# Patient Record
Sex: Male | Born: 1980 | Race: White | Hispanic: No | Marital: Single | Smoking: Current some day smoker
Health system: Southern US, Community
[De-identification: ages and names within clinical notes are randomized; demographics above are authoritative.]

## PROBLEM LIST (undated history)

## (undated) HISTORY — PX: APPENDECTOMY: SHX54

## (undated) HISTORY — PX: ABDOMINAL SURGERY: SHX537

---

## 2014-04-19 ENCOUNTER — Emergency Department (INDEPENDENT_AMBULATORY_CARE_PROVIDER_SITE_OTHER)
Admission: EM | Admit: 2014-04-19 | Discharge: 2014-04-19 | Disposition: A | Discharge status: Home / Self Care | Payer: Self-pay | Source: Home / Self Care | Attending: Family Medicine | Admitting: Family Medicine

## 2014-04-19 ENCOUNTER — Encounter (HOSPITAL_COMMUNITY): Payer: Self-pay | Admitting: Emergency Medicine

## 2014-04-19 DIAGNOSIS — K047 Periapical abscess without sinus: Secondary | ICD-10-CM

## 2014-04-19 DIAGNOSIS — J0100 Acute maxillary sinusitis, unspecified: Secondary | ICD-10-CM

## 2014-04-19 MED ORDER — IPRATROPIUM BROMIDE 0.06 % NA SOLN: 2.0000 | Freq: Four times a day (QID) | NASAL | Status: DC

## 2014-04-19 MED ORDER — AMOXICILLIN-POT CLAVULANATE 875-125 MG PO TABS: 1.0000 | ORAL_TABLET | Freq: Two times a day (BID) | ORAL | Status: DC

## 2014-04-19 MED ORDER — FLUTICASONE PROPIONATE 50 MCG/ACT NA SUSP: 2.0000 | Freq: Every day | NASAL | Status: DC

## 2014-04-19 NOTE — ED Notes (Signed)
C/o cold sx onset 4-5 days; sx include congestion and PND Denies cough, fevers, chills Also c/o upper dental pain onset 2 days Does not have a dentist; just moved from [place] a month ago Alert, no signs of acute distress.

## 2014-04-19 NOTE — ED Provider Notes (Signed)
CSN: [id number]     Arrival date & time 04/19/14  [id number] History   First MD Initiated Contact with Patient 04/19/14 1028     Chief Complaint  Patient presents with  . URI  . Dental Pain   (Consider location/radiation/quality/duration/timing/severity/associated sxs/prior Treatment) HPI  Cold symptoms: started 5 days ago. Sinus congestion. Now with sore throat. Denies fevers, nausea vomiting diarrhea rash, CP, SOB. OTC cold meds w/o much benefit.   Dental pain: Upper L canine tooth. Broke several months ago. Developed pain in tooth 2 days ago. Denies purulence or bloody discharge. Getting worse. Symptoms are constant. Meloxicam w/ some improvement.   Tobacco use. Changed from 1ppd to Vapors. Down from 24mg  to 10mg  of nicotine.   History reviewed. No pertinent past medical history. History reviewed. No pertinent past surgical history. Family History  Problem Relation Age of Onset  . Cancer Neg Hx   . Diabetes Neg Hx   . Heart failure Neg Hx    History  Substance Use Topics  . Smoking status: Former Smoker    Types: Cigarettes  . Smokeless tobacco: Not on file  . Alcohol Use: Yes    Review of Systems Per HPI with all other pertinent systems negative.  Allergies  Review of patient's allergies indicates no known allergies.  Home Medications   Prior to Admission medications   Medication Sig Start Date End Date Taking? Authorizing Provider  amoxicillin-clavulanate (AUGMENTIN) 875-125 MG per tablet Take 1 tablet by mouth 2 (two) times daily. 04/19/14   Ozella Rocksavid J Adreanna Fickel, MD  fluticasone (FLONASE) 50 MCG/ACT nasal spray Place 2 sprays into both nostrils at bedtime. 04/19/14   Ozella Rocksavid J Jerman Tinnon, MD  ipratropium (ATROVENT) 0.06 % nasal spray Place 2 sprays into both nostrils 4 (four) times daily. 04/19/14   Ozella Rocksavid J Norberto Wishon, MD   BP 127/88 mmHg  Pulse 64  Temp(Src) 97.9 F (36.6 C) (Oral)  Resp 16  SpO2 99% Physical Exam  Constitutional: He is oriented to person, place, and time. He  appears well-developed and well-nourished.  HENT:  Head: Normocephalic.  Left maxillary sinus tender to palpation.  Numerous dental caries with canine broken off at the level of the gums with surrounding gingival swelling and tenderness to palpation. No purulent or bloody discharge, no fluctuance.  Eyes: EOM are normal. Pupils are equal, round, and reactive to light.  Neck: Normal range of motion.  Cardiovascular: Normal rate and normal heart sounds.   No murmur heard. Pulmonary/Chest: Effort normal and breath sounds normal. No respiratory distress.  Abdominal: He exhibits no distension.  Musculoskeletal: Normal range of motion.  Neurological: He is alert and oriented to person, place, and time.  Skin: Skin is warm.  Psychiatric: He has a normal mood and affect. His behavior is normal. Judgment and thought content normal.    ED Course  Procedures (including critical care time) Labs Review Labs Reviewed - No data to display  Imaging Review No results found.   MDM   1. Acute maxillary sinusitis, recurrence not specified   2. Dental infection    Start augmentin Nasal atrovent and flonase NSAIDs Continue to wean off nicotine vapor Precautions given and all questions answered  Shelly Flattenavid Tysen Roesler, MD Family Medicine 04/19/2014, 10:53 AM      Ozella Rocksavid J Kaamil Morefield, MD 04/19/14 1053

## 2014-04-19 NOTE — Discharge Instructions (Signed)
You have a double infection including a left-sided sinus infection as well as a dental infection. Please take the antibiotics for this. Please use the nasal Atrovent and nasal Flonase for sinus congestion and nasal discharge. Please use ibuprofen 400-600 mg every 6 hours for pain.

## 2015-01-30 ENCOUNTER — Encounter: Payer: Self-pay | Admitting: Emergency Medicine

## 2015-01-30 ENCOUNTER — Emergency Department
Admission: EM | Admit: 2015-01-30 | Discharge: 2015-01-30 | Disposition: A | Discharge status: Home / Self Care | Payer: Self-pay | Attending: Emergency Medicine | Admitting: Emergency Medicine

## 2015-01-30 ENCOUNTER — Emergency Department: Payer: Self-pay

## 2015-01-30 DIAGNOSIS — M62830 Muscle spasm of back: Secondary | ICD-10-CM | POA: Insufficient documentation

## 2015-01-30 DIAGNOSIS — F17210 Nicotine dependence, cigarettes, uncomplicated: Secondary | ICD-10-CM | POA: Insufficient documentation

## 2015-01-30 DIAGNOSIS — M546 Pain in thoracic spine: Secondary | ICD-10-CM | POA: Insufficient documentation

## 2015-01-30 MED ORDER — CYCLOBENZAPRINE HCL 10 MG PO TABS: 10.0000 mg | ORAL_TABLET | Freq: Every day | ORAL | Status: DC

## 2015-01-30 MED ORDER — NAPROXEN 500 MG PO TABS: 500.0000 mg | ORAL_TABLET | Freq: Two times a day (BID) | ORAL | Status: DC

## 2015-01-30 MED ORDER — KETOROLAC TROMETHAMINE 30 MG/ML IJ SOLN
30.0000 mg | Freq: Once | INTRAMUSCULAR | Status: AC
  Administered 2015-01-30: 30 mg via INTRAMUSCULAR
  Filled 2015-01-30: qty 1

## 2015-01-30 NOTE — ED Notes (Signed)
To x-ray via stretcher.
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
## 2015-01-30 NOTE — Discharge Instructions (Signed)
Back Exercises °The following exercises strengthen the muscles that help to support the back. They also help to keep the lower back flexible. Doing these exercises can help to prevent back pain or lessen existing pain. °If you have back pain or discomfort, try doing these exercises 2-3 times each day or as told by your health care provider. When the pain goes away, do them once each day, but increase the number of times that you repeat the steps for each exercise (do more repetitions). If you do not have back pain or discomfort, do these exercises once each day or as told by your health care provider. °EXERCISES °Single Knee to Chest °Repeat these steps 3-5 times for each leg: °· Lie on your back on a firm bed or the floor with your legs extended. °· Bring one knee to your chest. Your other leg should stay extended and in contact with the floor. °· Hold your knee in place by grabbing your knee or thigh. °· Pull on your knee until you feel a gentle stretch in your lower back. °· Hold the stretch for 10-30 seconds. °· Slowly release and straighten your leg. °Pelvic Tilt °Repeat these steps 5-10 times: °· Lie on your back on a firm bed or the floor with your legs extended. °· Bend your knees so they are pointing toward the ceiling and your feet are flat on the floor. °· Tighten your lower abdominal muscles to press your lower back against the floor. This motion will tilt your pelvis so your tailbone points up toward the ceiling instead of pointing to your feet or the floor. °· With gentle tension and even breathing, hold this position for 5-10 seconds. °Cat-Cow °Repeat these steps until your lower back becomes more flexible: °· Get into a hands-and-knees position on a firm surface. Keep your hands under your shoulders, and keep your knees under your hips. You may place padding under your knees for comfort. °· Let your head hang down, and point your tailbone toward the floor so your lower back becomes rounded like the  back of a cat. °· Hold this position for 5 seconds. °· Slowly lift your head and point your tailbone up toward the ceiling so your back forms a sagging arch like the back of a cow. °· Hold this position for 5 seconds. °Press-Ups °Repeat these steps 5-10 times: °· Lie on your abdomen (face-down) on the floor. °· Place your palms near your head, about shoulder-width apart. °· While you keep your back as relaxed as possible and keep your hips on the floor, slowly straighten your arms to raise the top half of your body and lift your shoulders. Do not use your back muscles to raise your upper torso. You may adjust the placement of your hands to make yourself more comfortable. °· Hold this position for 5 seconds while you keep your back relaxed. °· Slowly return to lying flat on the floor. °Bridges °Repeat these steps 10 times: °· Lie on your back on a firm surface. °· Bend your knees so they are pointing toward the ceiling and your feet are flat on the floor. °· Tighten your buttocks muscles and lift your buttocks off of the floor until your waist is at almost the same height as your knees. You should feel the muscles working in your buttocks and the back of your thighs. If you do not feel these muscles, slide your feet 1-2 inches farther away from your buttocks. °· Hold this position for 3-5   seconds.  Slowly lower your hips to the starting position, and allow your buttocks muscles to relax completely. If this exercise is too easy, try doing it with your arms crossed over your chest. Abdominal Crunches Repeat these steps 5-10 times: 1. Lie on your back on a firm bed or the floor with your legs extended. 2. Bend your knees so they are pointing toward the ceiling and your feet are flat on the floor. 3. Cross your arms over your chest. 4. Tip your chin slightly toward your chest without bending your neck. 5. Tighten your abdominal muscles and slowly raise your trunk (torso) high enough to lift your shoulder blades  a tiny bit off of the floor. Avoid raising your torso higher than that, because it can put too much stress on your low back and it does not help to strengthen your abdominal muscles. 6. Slowly return to your starting position. Back Lifts Repeat these steps 5-10 times: 1. Lie on your abdomen (face-down) with your arms at your sides, and rest your forehead on the floor. 2. Tighten the muscles in your legs and your buttocks. 3. Slowly lift your chest off of the floor while you keep your hips pressed to the floor. Keep the back of your head in line with the curve in your back. Your eyes should be looking at the floor. 4. Hold this position for 3-5 seconds. 5. Slowly return to your starting position. SEEK MEDICAL CARE IF:  Your back pain or discomfort gets much worse when you do an exercise.  Your back pain or discomfort does not lessen within 2 hours after you exercise. If you have any of these problems, stop doing these exercises right away. Do not do them again unless your health care provider says that you can. SEEK IMMEDIATE MEDICAL CARE IF:  You develop sudden, severe back pain. If this happens, stop doing the exercises right away. Do not do them again unless your health care provider says that you can.   This information is not intended to replace advice given to you by your health care provider. Make sure you discuss any questions you have with your health care provider.   Document Released: 03/13/2004 Document Revised: 10/25/2014 Document Reviewed: 03/30/2014 Elsevier Interactive Patient Education 2016 Alvan therapy can help ease sore, stiff, injured, and tight muscles and joints. Heat relaxes your muscles, which may help ease your pain.  RISKS AND COMPLICATIONS If you have any of the following conditions, do not use heat therapy unless your health care provider has approved:  Poor circulation.  Healing wounds or scarred skin in the area being  treated.  Diabetes, heart disease, or high blood pressure.  Not being able to feel (numbness) the area being treated.  Unusual swelling of the area being treated.  Active infections.  Blood clots.  Cancer.  Inability to communicate pain. This may include young children and people who have problems with their brain function (dementia).  Pregnancy. Heat therapy should only be used on old, pre-existing, or long-lasting (chronic) injuries. Do not use heat therapy on new injuries unless directed by your health care provider. HOW TO USE HEAT THERAPY There are several different kinds of heat therapy, including:  Moist heat pack.  Warm water bath.  Hot water bottle.  Electric heating pad.  Heated gel pack.  Heated wrap.  Electric heating pad. Use the heat therapy method suggested by your health care provider. Follow your health care provider's instructions on when  and how to use heat therapy. GENERAL HEAT THERAPY RECOMMENDATIONS  Do not sleep while using heat therapy. Only use heat therapy while you are awake.  Your skin may turn pink while using heat therapy. Do not use heat therapy if your skin turns red.  Do not use heat therapy if you have new pain.  High heat or long exposure to heat can cause burns. Be careful when using heat therapy to avoid burning your skin.  Do not use heat therapy on areas of your skin that are already irritated, such as with a rash or sunburn. SEEK MEDICAL CARE IF:  You have blisters, redness, swelling, or numbness.  You have new pain.  Your pain is worse. MAKE SURE YOU:  Understand these instructions.  Will watch your condition.  Will get help right away if you are not doing well or get worse.   This information is not intended to replace advice given to you by your health care provider. Make sure you discuss any questions you have with your health care provider.   Document Released: 04/28/2011 Document Revised: 02/24/2014 Document  Reviewed: 03/29/2013 Elsevier Interactive Patient Education [employer] Inc[employer]
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
## 2015-01-30 NOTE — ED Notes (Signed)
States he has a hx of lower back pain but developed pain to mid back and radiates into neck. But also having some numbness to left leg denies any trauma or fever

## 2015-04-12 ENCOUNTER — Emergency Department
Admission: EM | Admit: 2015-04-12 | Discharge: 2015-04-12 | Disposition: A | Discharge status: Home / Self Care | Payer: Self-pay | Attending: Emergency Medicine | Admitting: Emergency Medicine

## 2015-04-12 ENCOUNTER — Encounter: Payer: Self-pay | Admitting: Emergency Medicine

## 2015-04-12 DIAGNOSIS — K047 Periapical abscess without sinus: Secondary | ICD-10-CM | POA: Insufficient documentation

## 2015-04-12 DIAGNOSIS — K0381 Cracked tooth: Secondary | ICD-10-CM | POA: Insufficient documentation

## 2015-04-12 DIAGNOSIS — F17210 Nicotine dependence, cigarettes, uncomplicated: Secondary | ICD-10-CM | POA: Insufficient documentation

## 2015-04-12 DIAGNOSIS — K0889 Other specified disorders of teeth and supporting structures: Secondary | ICD-10-CM | POA: Insufficient documentation

## 2015-04-12 DIAGNOSIS — Z79899 Other long term (current) drug therapy: Secondary | ICD-10-CM | POA: Insufficient documentation

## 2015-04-12 DIAGNOSIS — Z791 Long term (current) use of non-steroidal anti-inflammatories (NSAID): Secondary | ICD-10-CM | POA: Insufficient documentation

## 2015-04-12 DIAGNOSIS — K029 Dental caries, unspecified: Secondary | ICD-10-CM

## 2015-04-12 DIAGNOSIS — K0263 Dental caries on smooth surface penetrating into pulp: Secondary | ICD-10-CM | POA: Insufficient documentation

## 2015-04-12 MED ORDER — PENICILLIN V POTASSIUM 500 MG PO TABS
500.0000 mg | ORAL_TABLET | Freq: Once | ORAL | Status: AC
  Administered 2015-04-12: 500 mg via ORAL
  Filled 2015-04-12: qty 1

## 2015-04-12 MED ORDER — PENICILLIN V POTASSIUM 500 MG PO TABS: 500.0000 mg | ORAL_TABLET | Freq: Four times a day (QID) | ORAL | Status: DC

## 2015-04-12 NOTE — ED Provider Notes (Signed)
[facility] Emergency Department Provider Note ____________________________________________  Time seen: 52  I have reviewed the triage vital signs and the nursing notes.  HISTORY  Chief Complaint  Dental Pain  HPI Edgar Matthews is a 35 y.o. male with the complaint of left upper face swelling and pain. He reports a chronically broken left upper canine tooth. He noted facial swelling, pain and purulent drainage in the mouth. He denies fevers, chills, or sweats. He rates his pain at a 5/10 in triage.  History reviewed. No pertinent past medical history.  There are no active problems to display for this patient.  Past Surgical History  Procedure Laterality Date  . Appendectomy    . Abdominal surgery      Current Outpatient Rx  Name  Route  Sig  Dispense  Refill  . cyclobenzaprine (FLEXERIL) 10 MG tablet   Oral   Take 1 tablet (10 mg total) by mouth at bedtime.   30 tablet   0   . naproxen (NAPROSYN) 500 MG tablet   Oral   Take 1 tablet (500 mg total) by mouth 2 (two) times daily with a meal.   30 tablet   0   . penicillin v potassium (VEETID) 500 MG tablet   Oral   Take 1 tablet (500 mg total) by mouth 4 (four) times daily.   40 tablet   0     Allergies Review of patient's allergies indicates no known allergies.  Family History  Problem Relation Age of Onset  . Cancer Neg Hx   . Diabetes Neg Hx   . Heart failure Neg Hx     Social History Social History  Substance Use Topics  . Smoking status: Current Some Day Smoker -- 0.50 packs/day    Types: Cigarettes  . Smokeless tobacco: None  . Alcohol Use: Yes   Review of Systems  Constitutional: Negative for fever. Eyes: Negative for visual changes. ENT: Negative for sore throat. Dental pain as above Cardiovascular: Negative for chest pain. Respiratory: Negative for shortness of breath. Gastrointestinal: Negative for abdominal pain, vomiting and diarrhea. Skin: Negative for  rash. Neurological: Negative for headaches, focal weakness or numbness. ____________________________________________  PHYSICAL EXAM:  VITAL SIGNS: ED Triage Vitals  Enc Vitals Group     BP 04/12/15 1707 117/75 mmHg     Pulse Rate 04/12/15 1707 72     Resp 04/12/15 1707 16     Temp 04/12/15 1707 98.6 F (37 C)     Temp Source 04/12/15 1707 Oral     SpO2 04/12/15 1707 98 %     Weight 04/12/15 1707 180 lb (81.647 kg)     Height 04/12/15 1707 6' (1.829 m)     Head Cir --      Peak Flow --      Pain Score 04/12/15 1707 5     Pain Loc --      Pain Edu? --      Excl. in GC? --    Constitutional: Alert and oriented. Well appearing and in no distress. Head: Normocephalic and atraumatic.      Eyes: Conjunctivae are normal. PERRL. Normal extraocular movements   Nose: No congestion/rhinorrhea.   Mouth/Throat: Mucous membranes are moist. Uvula is midline. Tonsils are flat. Patient noted to have chronic dental caries through the mouth. His pain is localized at the upper left canine which is broken and decayed to the gumline. No appreciable gum swelling, fluctuance, or pointing abscess is appreciated.  Neck: Supple. No thyromegaly. Hematological/Lymphatic/Immunological: No cervical lymphadenopathy. Cardiovascular: Normal rate, regular rhythm.  Respiratory: Normal respiratory effort. No wheezes/rales/rhonchi. Musculoskeletal: Nontender with normal range of motion in all extremities.  Neurologic:  Normal gait without ataxia. Normal speech and language. No gross focal neurologic deficits are appreciated. Skin:  Skin is warm, dry and intact. No rash noted. Psychiatric: Mood and affect are normal. Patient exhibits appropriate insight and judgment. ____________________________________________  PROCEDURES  Pen VK 500 mg PO ____________________________________________  INITIAL IMPRESSION / ASSESSMENT AND PLAN / ED COURSE  Patient with acute dental pain secondary to chronic dental  caries and a chronically fractured canine tooth. He'll be treated empirically for dental abscess with penicillin. He is encouraged to follow-up with the dental provider on the list that is given. Patient is to rinse daily with warm salt water and use a soft bristle toothbrush for routine dental hygiene. Return to the ED for acutely worsening symptoms. 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

## 2015-04-12 NOTE — Discharge Instructions (Signed)
Dental Abscess A dental abscess is pus in or around a tooth. HOME CARE  Take medicines only as told by your dentist.  If you were prescribed antibiotic medicine, finish all of it even if you start to feel better.  Rinse your mouth (gargle) often with salt water.  Do not drive or use heavy machinery, like a lawn mower, while taking pain medicine.  Do not apply heat to the outside of your mouth.  Keep all follow-up visits as told by your dentist. This is important. GET HELP IF:  Your pain is worse, and medicine does not help. GET HELP RIGHT AWAY IF:  You have a fever or chills.  Your symptoms suddenly get worse.  You have a very bad headache.  You have problems breathing or swallowing.  You have trouble opening your mouth.  You have puffiness (swelling) in your neck or around your eye.   This information is not intended to replace advice given to you by your health care provider. Make sure you discuss any questions you have with your health care provider.   Document Released: 06/20/2014 Document Reviewed: 06/20/2014 Elsevier Interactive Patient Education 2016 Elsevier Inc.  Dental Caries Dental caries is tooth decay. This decay can cause a hole in teeth (cavity) that can get bigger and deeper over time. HOME CARE  Brush and floss your teeth. Do this at least two times a day.  Use a fluoride toothpaste.  Use a mouth rinse if told by your dentist or doctor.  Eat less sugary and starchy foods. Drink less sugary drinks.  Avoid snacking often on sugary and starchy foods. Avoid sipping often on sugary drinks.  Keep regular checkups and cleanings with your dentist.  Use fluoride supplements if told by your dentist or doctor.  Allow fluoride to be applied to teeth if told by your dentist or doctor.   This information is not intended to replace advice given to you by your health care provider. Make sure you discuss any questions you have with your health care  provider.   Document Released: 11/13/2007 Document Revised: 02/24/2014 Document Reviewed: 02/06/2012 Elsevier Interactive Patient Education 2016 Elsevier Inc.  Dental Pain Dental pain may be caused by many things, including:  Tooth decay (cavities or caries). Cavities cause the nerve of your tooth to be open to air and hot or cold temperatures. This can cause pain or discomfort.  Abscess or infection. A dental abscess is an area that is full of infected pus from a bacterial infection in the inner part of the tooth (pulp). It usually happens at the end of the tooth's root.  Injury.  An unknown reason (idiopathic). Your pain may be mild or severe. It may only happen when:  You are chewing.  You are exposed to hot or cold temperature.  You are eating or drinking sugary foods or beverages, such as:  Soda.  Candy. Your pain may also be there all of the time. HOME CARE Watch your dental pain for any changes. Do these things to lessen your discomfort:  Take medicines only as told by your dentist.  If your dentist tells you to take an antibiotic medicine, finish all of it even if you start to feel better.  Keep all follow-up visits as told by your dentist. This is important.  Do not apply heat to the outside of your face.  Rinse your mouth or gargle with salt water if told by your dentist. This helps with pain and swelling.  You can  make salt water by adding  tsp of salt to 1 cup of warm water.  Apply ice to the painful area of your face:  Put ice in a plastic bag.  Place a towel between your skin and the bag.  Leave the ice on for 20 minutes, 2-3 times per day.  Avoid foods or drinks that cause you pain, such as:  Very hot or very cold foods or drinks.  Sweet or sugary foods or drinks. GET HELP IF:  Your pain is not helped with medicines.  Your symptoms are worse.  You have new symptoms. GET HELP RIGHT AWAY IF:  You cannot open your mouth.  You are having  trouble breathing or swallowing.  You have a fever.  Your face, neck, or jaw is puffy (swollen).   This information is not intended to replace advice given to you by your health care provider. Make sure you discuss any questions you have with your health care provider.   Document Released: 07/23/2007 Document Revised: 06/20/2014 Document Reviewed: 01/30/2014 Elsevier Interactive Patient Education 2016 Elsevier Inc.  Preventive Dental Care, Adult Preventive dental care includes seeing a dentist regularly and practicing good dental care (oral hygiene) at home. These actions can help to prevent cavities and other tooth problems, root canal problems, gum disease (gingivitis), and tooth loss. Regular dental exams may also help your health care provider to diagnose other medical problems. Many diseases, including mouth cancers, have early signs that can be found during a preventive dental care visit. WHAT CAN I EXPECT DURING MY DENTAL VISITS? Many adults see their dentist one or two times each year for oral exams and cleanings. Talk with your dentist about the best preventive dental care schedule for you. At your visit, your dentist may ask you about:  Your overall health and diet.  Any new symptoms, such as:  Bleeding gums.  Mouth, tooth, or jaw pain. Your dentist will do a mouth (oral) exam to check for:  Cavities.  Gingivitis or other problems.  Signs of cancer.  Neck swelling or lumps.  Abnormal jaw movement or pain in the jaw joint. You may also have:  Dental X-rays.  Your teeth cleaned. If you have an early problem, like a cavity, your dentist will schedule time for you to get treatment. If you have a tooth root problem, gum disease, or a sign of another disease, your dentist may send you to see another health care provider for care. HOW CAN I CARE FOR MY TEETH AT HOME?  Brush with an approved fluoride toothpaste every morning and night. If possible, brush within 10 minutes  after every meal.  Floss one time every day.  Periodically check your teeth for white or brown spots after brushing. These may be signs of cavities.  Check your gums for swelling or bleeding. These may be signs of gum disease, such as gingivitis or periodontitis.  Make sure your diet includes plenty of fruits, vegetables, milk and dairy products, whole grains, and proteins. Do not eat a lot of starchy foods or foods with added sugar. Talk with your health care provider if you have questions about following a healthy diet.  Avoid sodas, sugary snacks, and sticky candies.  Do not smoke.  Do not get mouth piercings.  If you have tooth or gum pain, gargle with a salt-water mixture 3-4 times per day or as needed. To make a salt-water mixture, completely dissolve -1 tsp of salt in 1 cup of warm water.  Take over-the  counter and prescription medicines only as told by your dentist.  If you have a permanent tooth knocked out:  Find the tooth.  Pick it up by the top (crown) with a tissue or gauze.  Wash the tooth for no more than 10 seconds under cold, running water.  Try to put the tooth back into the gum socket.  Put the tooth in a glass of milk if you cannot get it back in place.  Go to your dentist right away. Take the tooth with you. WHEN SHOULD I SEEK MEDICAL CARE? Call your dentist if you have:  Gum, tooth, or jaw pain.  Red, swollen, or bleeding gums.  A tooth or teeth that are very sensitive to hot or cold.  Very bad breath.  A problem with a filling, crown, implant, or denture.  A broken or loose tooth.  A growth or sore in your mouth that is not going away. FOR MORE INFORMATION American Dental Association: http://fox-wallace.com/    This information is not intended to replace advice given to you by your health care provider. Make sure you discuss any questions you have with your health care provider.   Document Released: 10/25/2014 Document Reviewed: 07/18/2014 Elsevier  Interactive Patient Education [employer]   Take the antibiotic as directed. Follow-up with one of the dental clinics listed below.   OPTIONS FOR DENTAL FOLLOW UP CARE  [place] Department of Health and Human Services - Local Safety Net Dental Clinics [email or web address]   [facility] [phone])  Sharl Ma [phone])  [place] [phone])  [facility] [phone])  [facility] [phone]) This clinic caters to the indigent population and is on a lottery system. Location: [employer] [facility], [employer], [address] [place] Clinic Hours: Wednesdays from 6pm - 9pm, patients seen by a lottery system. For dates, call or go to [email or web address] Services: Cleanings, fillings and simple extractions. Payment Options: DENTAL WORK IS FREE OF CHARGE. Bring proof of income or support. Best way to get seen: Arrive at 5:15 pm - this is a lottery, NOT first come/first serve, so arriving earlier will not increase your chances of being seen.     [facility] [phone] Select option 1 for emergencies   Location: [facility], [place], [address] [place] Clinic Hours: No walk-ins accepted - call the day before to schedule an appointment. Check in times are 9:30 am and 1:30 pm. Services: Simple extractions, temporary fillings, pulpectomy/pulp debridement, uncomplicated abscess drainage. Payment Options: PAYMENT IS DUE AT THE TIME OF SERVICE.  Fee is usually $100-200, additional surgical procedures (e.g. abscess drainage) may be extra. Cash, checks, Visa/MasterCard accepted.  Can file Medicaid if patient is covered for dental - patient should call case worker to check. No discount for [facility] patients. Best way to get seen: MUST call the day before  and get onto the schedule. Can usually be seen the next 1-2 days. No walk-ins accepted.     [facility] [phone]   Location: [facility], [address] [place] Clinic Hours: M, W, Th, F 8am or 1:30pm, Tues 9a or 1:30 - first come/first served. Services: Simple extractions, temporary fillings, uncomplicated abscess drainage.  You do not need to be an [facility] resident. Payment Options: PAYMENT IS DUE AT THE TIME OF SERVICE. Dental insurance, otherwise sliding scale - bring proof of income or support. Depending on income  and treatment needed, cost is usually $50-200. Best way to get seen: Arrive early as it is first come/first served.     [facility] [phone]   Location: [address] Clinic Hours: Mon-Thu 8a-5p Services: Most basic dental services including extractions and fillings. Payment Options: PAYMENT IS DUE AT THE TIME OF SERVICE. Sliding scale, up to 50% off - bring proof if income or support. Medicaid with dental option accepted. Best way to get seen: Call to schedule an appointment, can usually be seen within 2 weeks OR they will try to see walk-ins - show up at 8a or 2p (you may have to wait).     [facility] [phone] [place]IDENTS ONLY   Location: [facility], [address] [place], [place] [address] Clinic Hours: By appointment only. Monday - Thursday 8am-5pm, Friday 8am-12pm Services: Cleanings, fillings, extractions. Payment Options: PAYMENT IS DUE AT THE TIME OF SERVICE. Cash, Visa or MasterCard. Sliding scale - $30 minimum per service. Best way to get seen: Come in to office, complete packet and make an appointment - need proof of income or support monies for each household member and proof of [facility] residence. Usually takes about a month to get in.     [facility]  Cl[facility] [phone]   Location: [address] [facility] Hours: Walk-in Urgent Care Dental Services are offered Monday-Friday mornings only. The numbers of emergencies accepted daily is limited to the number of providers available. Maximum 15 - Mondays, Wednesdays & Thursdays Maximum 10 - Tuesdays & Fridays Services: You do not need to be a [facility] resident to be seen for a dental emergency. Emergencies are defined as pain, swelling, abnormal bleeding, or dental trauma. Walkins will receive x-rays if needed. NOTE: Dental cleaning is not an emergency. Payment Options: PAYMENT IS DUE AT THE TIME OF SERVICE. Minimum co-pay is $40.00 for uninsured patients. Minimum co-pay is $3.00 for Medicaid with dental coverage. Dental Insurance is accepted and must be presented at time of visit. Medicare does not cover dental. Forms of payment: Cash, credit card, checks. Best way to get seen: If not previously registered with the clinic, walk-in dental registration begins at 7:15 am and is on a first come/first serve basis. If previously registered with the clinic, call to make an appointment.     [facility] [phone] [place] RESIDENTS ONLY   Location: [address] [place], [place] Clinic Hours: Mon-Thu 10a-2p Services: Extractions only! Payment Options: FREE (donations accepted) - bring proof of income or support Best way to get seen: Call and schedule an appointment OR come at 8am on the 1st Monday of every month (except for holidays) when it is first come/first served.     [facility] [phone]   Location: [address] [place], [place] Clinic Hours: Friday mornings Services, Payment Options, Best way to get seen: Call for info

## 2015-04-12 NOTE — ED Notes (Signed)
Pt reports left upper tooth abcess x3 days.

## 2016-05-01 ENCOUNTER — Emergency Department
Admission: EM | Admit: 2016-05-01 | Discharge: 2016-05-01 | Disposition: A | Discharge status: Home / Self Care | Payer: Self-pay | Attending: Emergency Medicine | Admitting: Emergency Medicine

## 2016-05-01 DIAGNOSIS — K029 Dental caries, unspecified: Secondary | ICD-10-CM | POA: Insufficient documentation

## 2016-05-01 DIAGNOSIS — F17210 Nicotine dependence, cigarettes, uncomplicated: Secondary | ICD-10-CM | POA: Insufficient documentation

## 2016-05-01 MED ORDER — PENICILLIN V POTASSIUM 500 MG PO TABS: 500.0000 mg | ORAL_TABLET | Freq: Four times a day (QID) | ORAL | 0 refills | Status: DC

## 2016-05-01 NOTE — Discharge Instructions (Signed)
Get an appointment with one of the dental clinics listed on your discharge papers.  Ibuprofen or tylenol for pain.  Take Pen V K until finished.      OPTIONS FOR DENTAL FOLLOW UP CARE  [place] Department of Health and Human Services - Local Safety Net Dental Clinics [email or web address]   [facility] [phone])  Sharl Ma [phone])  [place] [facility] [phone])  [facility] [phone])  [facility] [phone]) This clinic caters to the indigent population and is on a lottery system. Location: [employer] [facility], [employer], [address], [place] Clinic Hours: Wednesdays from 6pm - 9pm, patients seen by a lottery system. For dates, call or go to [email or web address] Services: Cleanings, fillings and simple extractions. Payment Options: DENTAL WORK IS FREE OF CHARGE. Bring proof of income or support. Best way to get seen: Arrive at 5:15 pm - this is a lottery, NOT first come/first serve, so arriving earlier will not increase your chances of being seen.     [facility] [phone] Select option 1 for emergencies   Location: [facility], [place], [address], [place] Clinic Hours: No walk-ins accepted - call the day before to schedule an appointment. Check in times are 9:30 am and 1:30 pm. Services: Simple extractions, temporary fillings, pulpectomy/pulp debridement, uncomplicated abscess drainage. Payment Options: PAYMENT IS DUE AT THE TIME OF SERVICE.  Fee is usually $100-200, additional surgical procedures (e.g. abscess drainage) may be extra. Cash, checks, Visa/MasterCard accepted.  Can file Medicaid if patient is covered for dental - patient should call case worker to check. No discount for [facility] patients. Best way to get seen: MUST call the day  before and get onto the schedule. Can usually be seen the next 1-2 days. No walk-ins accepted.     [facility] Services [phone]   Location: [facility], [address] [place] Clinic Hours: M, W, Th, F 8am or 1:30pm, Tues 9a or 1:30 - first come/first served. Services: Simple extractions, temporary fillings, uncomplicated abscess drainage.  You do not need to be an [facility] resident. Payment Options: PAYMENT IS DUE AT THE TIME OF SERVICE. Dental insurance, otherwise sliding scale - bring proof of income or support. Depending on income and treatment needed, cost is usually $50-200. Best way to get seen: Arrive early as it is first come/first served.     [facility] [phone]   Location: [address] Clinic Hours: Mon-Thu 8a-5p Services: Most basic dental services including extractions and fillings. Payment Options: PAYMENT IS DUE AT THE TIME OF SERVICE. Sliding scale, up to 50% off - bring proof if income or support. Medicaid with dental option accepted. Best way to get seen: Call to schedule an appointment, can usually be seen within 2 weeks OR they will try to see walk-ins - show up at 8a or 2p (you may have to wait).     [facility] [phone] [place]IDENTS ONLY   Location: [facility], [address] [place], [place] [address] Clinic Hours: By appointment only. Monday - Thursday 8am-5pm, Friday 8am-12pm Services: Cleanings, fillings, extractions. Payment Options: PAYMENT IS DUE AT THE TIME OF SERVICE. Cash, Visa or MasterCard. Sliding scale - $30 minimum per service. Best way to get seen: Come in to office, complete packet and make an appointment - need proof of income or support monies for each  household member and proof of [place] residence. Usually takes about a month to get in.     [facility]  Cl[facility] [phone]   Location: [address] [facility] Hours: Walk-in Urgent Care Dental Services are offered Monday-Friday mornings only. The numbers of emergencies accepted daily is limited to the number of providers available. Maximum 15 - Mondays, Wednesdays & Thursdays Maximum 10 - Tuesdays & Fridays Services: You do not need to be [place] [facility] resident to be seen for a dental emergency. Emergencies are defined as pain, swelling, abnormal bleeding, or dental trauma. Walkins will receive x-rays if needed. NOTE: Dental cleaning is not an emergency. Payment Options: PAYMENT IS DUE AT THE TIME OF SERVICE. Minimum co-pay is $40.00 for uninsured patients. Minimum co-pay is $3.00 for Medicaid with dental coverage. Dental Insurance is accepted and must be presented at time of visit. Medicare does not cover dental. Forms of payment: Cash, credit card, checks. Best way to get seen: If not previously registered with the clinic, walk-in dental registration begins at 7:15 am and is on a first come/first serve basis. If previously registered with the clinic, call to make an appointment.     [facility] [phone] [place] RESIDENTS ONLY   Location: [address], [place], [place] Clinic Hours: Mon-Thu 10a-2p Services: Extractions only! Payment Options: FREE (donations accepted) - bring proof of income or support Best way to get seen: Call and schedule an appointment OR come at 8am on the 1st Monday of every month (except for holidays) when it is first come/first served.     [facility] [phone]   Location: [address], [place] Clinic Hours: Friday mornings Services, Payment Options, Best way to get seen: Call for info

## 2016-05-01 NOTE — ED Provider Notes (Signed)
[facility] Emergency Department Provider Note   ____________________________________________   First MD Initiated Contact with Patient 05/01/16 [phone]     (approximate)  I have reviewed the triage vital signs and the nursing notes.   HISTORY  Chief Complaint Dental Pain    HPI Edgar Matthews is a 36 y.o. male is here complaining of right upper dental pain for the last day. Patient states that he broke a wisdom tooth off sometime ago but has not seen a dentist for it. Patient has not been taking any over-the-counter medication. Patient continues to smoke one half pack cigarettes per day.  Patient rates his pain as 5 out of 10 currently.   History reviewed. No pertinent past medical history.  There are no active problems to display for this patient.   Past Surgical History:  Procedure Laterality Date  . ABDOMINAL SURGERY    . APPENDECTOMY      Prior to Admission medications   Medication Sig Start Date End Date Taking? Authorizing Provider  penicillin v potassium (VEETID) 500 MG tablet Take 1 tablet (500 mg total) by mouth 4 (four) times daily. 05/01/16   Tommi Rumps, PA-C    Allergies Patient has no known allergies.  Family History  Problem Relation Age of Onset  . Cancer Neg Hx   . Diabetes Neg Hx   . Heart failure Neg Hx     Social History Social History  Substance Use Topics  . Smoking status: Current Some Day Smoker    Packs/day: 0.50    Types: Cigarettes  . Smokeless tobacco: Never Used  . Alcohol use Yes    Review of Systems Constitutional: No fever/chills Eyes: No visual changes. ENT: No sore throat.Positive dental pain. Cardiovascular: Denies chest pain. Respiratory: Denies shortness of breath. Gastrointestinal:   No nausea, no vomiting.   Musculoskeletal: Negative for back pain. Skin: Negative for rash. Neurological: Negative for headaches, focal weakness or numbness.  10-point ROS otherwise  negative.  ____________________________________________   PHYSICAL EXAM:  VITAL SIGNS: ED Triage Vitals  Enc Vitals Group     BP 05/01/16 0759 113/76     Pulse Rate 05/01/16 0759 84     Resp 05/01/16 0759 18     Temp 05/01/16 0759 97.9 F (36.6 C)     Temp Source 05/01/16 0759 Oral     SpO2 05/01/16 0759 100 %     Weight 05/01/16 0757 191 lb (86.6 kg)     Height 05/01/16 0757 6' (1.829 m)     Head Circumference --      Peak Flow --      Pain Score 05/01/16 0757 5     Pain Loc --      Pain Edu? --      Excl. in GC? --     Constitutional: Alert and oriented. Well appearing and in no acute distress. Eyes: Conjunctivae are normal. PERRL. EOMI. Head: Atraumatic. Nose: No congestion/rhinnorhea. Mouth/Throat: Mucous membranes are moist.  Oropharynx non-erythematous. Right upper gum is tender and slightly swollen. There is no actual abscess seen. Poor dental repair. Neck: No stridor.   Hematological/Lymphatic/Immunilogical: No cervical lymphadenopathy. Cardiovascular: Normal rate, regular rhythm. Grossly normal heart sounds.  Good peripheral circulation. Respiratory: Normal respiratory effort.  No retractions. Lungs CTAB. Musculoskeletal: No lower extremity tenderness nor edema.  Neurologic:  Normal speech and language. No gross focal neurologic deficits are appreciated. No gait instability. Skin:  Skin is warm, dry and intact. No rash noted. Psychiatric: Mood and  affect are normal. Speech and behavior are normal.  ____________________________________________   LABS (all labs ordered are listed, but only abnormal results are displayed)  Labs Reviewed - No data to display   PROCEDURES  Procedure(s) performed: None  Procedures  Critical Care performed: No  ____________________________________________   INITIAL IMPRESSION / ASSESSMENT AND PLAN / ED COURSE  Pertinent labs & imaging results that were available during my care of the patient were reviewed by me and  considered in my medical decision making (see chart for details).  Patient was given a list of dental clinics in the area that he qualifies for since he is not have any insurance. He is given a prescription for penicillin VK 500 mg 4 times a day. He is also to continue taking ibuprofen as needed for pain or Tylenol.      ____________________________________________   FINAL CLINICAL IMPRESSION(S) / ED DIAGNOSES  Final diagnoses:  Pain due to dental caries      NEW MEDICATIONS STARTED DURING THIS VISIT:  Discharge Medication List as of 05/01/2016  8:29 AM       Note:  This document was prepared using Dragon voice recognition software and may include unintentional dictation errors.    Tommi Rumpshonda L Littie Chiem, PA-C 05/01/16 1457    Governor Rooksebecca Lord, MD 05/01/16 1534

## 2016-05-01 NOTE — ED Notes (Signed)
See triage note states he has a broken wisdom tooth    Having increased pain over the past few days

## 2016-05-01 NOTE — ED Triage Notes (Signed)
Pt c/o right upper tooth pain for the past day

## 2016-06-02 ENCOUNTER — Encounter: Payer: Self-pay | Admitting: *Deleted

## 2016-06-02 ENCOUNTER — Emergency Department: Payer: Self-pay

## 2016-06-02 ENCOUNTER — Emergency Department
Admission: EM | Admit: 2016-06-02 | Discharge: 2016-06-02 | Disposition: A | Discharge status: Home / Self Care | Payer: Self-pay | Attending: Emergency Medicine | Admitting: Emergency Medicine

## 2016-06-02 DIAGNOSIS — J209 Acute bronchitis, unspecified: Secondary | ICD-10-CM

## 2016-06-02 DIAGNOSIS — F17210 Nicotine dependence, cigarettes, uncomplicated: Secondary | ICD-10-CM | POA: Insufficient documentation

## 2016-06-02 LAB — POCT RAPID STREP A: STREPTOCOCCUS, GROUP A SCREEN (DIRECT): NEGATIVE

## 2016-06-02 MED ORDER — AZITHROMYCIN 250 MG PO TABS: ORAL_TABLET | ORAL | 0 refills | Status: DC

## 2016-06-02 MED ORDER — ALBUTEROL SULFATE HFA 108 (90 BASE) MCG/ACT IN AERS: 2.0000 | INHALATION_SPRAY | Freq: Four times a day (QID) | RESPIRATORY_TRACT | 2 refills | Status: DC | PRN

## 2016-06-02 MED ORDER — PREDNISONE 10 MG PO TABS: ORAL_TABLET | ORAL | 0 refills | Status: DC

## 2016-06-02 MED ORDER — IPRATROPIUM-ALBUTEROL 0.5-2.5 (3) MG/3ML IN SOLN
3.0000 mL | Freq: Once | RESPIRATORY_TRACT | Status: AC
  Administered 2016-06-02: 3 mL via RESPIRATORY_TRACT
  Filled 2016-06-02: qty 3

## 2016-06-02 NOTE — ED Notes (Signed)
See triage note  States he developed sore throat   Congestion and fever couple of days ago low grade fever on arrival   States fever of 102 at home

## 2016-06-02 NOTE — Discharge Instructions (Signed)
Follow-up with [facility] acute-care if any continued problems. Discontinue smoking. Begin taking Zithromax and prednisone as directed. Use your Proventil inhaler 2 puffs every 6 hours as needed for shortness of breath or wheezing.

## 2016-06-02 NOTE — ED Triage Notes (Signed)
States sore throat, chest congestion, cough and fever for 3 days

## 2016-06-02 NOTE — ED Provider Notes (Signed)
[facility] Emergency Department Provider Note   ____________________________________________   First MD Initiated Contact with Patient 06/02/16 1002     (approximate)  I have reviewed the triage vital signs and the nursing notes.   HISTORY  Chief Complaint Fever; Cough; and Sore Throat    HPI Edgar Matthews is a 36 y.o. male is here with complaint of sore throat, cough, congestion, and fever for the last 3 days. Patient has a history of pneumonia and bronchitis. Patient continues to smoke one half pack cigarettes per day. He states his fever is been as high as 102 at home. He has not taken any over-the-counter medication for his symptoms.Patient rates his discomfort as a 7 out of 10. He is unaware of any wheezing but states that the cough has caused throat irritation.   History reviewed. No pertinent past medical history.  There are no active problems to display for this patient.   Past Surgical History:  Procedure Laterality Date  . ABDOMINAL SURGERY    . APPENDECTOMY      Prior to Admission medications   Medication Sig Start Date End Date Taking? Authorizing Provider  albuterol (PROVENTIL HFA;VENTOLIN HFA) 108 (90 Base) MCG/ACT inhaler Inhale 2 puffs into the lungs every 6 (six) hours as needed for wheezing or shortness of breath. 06/02/16   Tommi Rumps, PA-C  azithromycin (ZITHROMAX Z-PAK) 250 MG tablet Take 2 tablets (500 mg) on  Day 1,  followed by 1 tablet (250 mg) once daily on Days 2 through 5. 06/02/16   Tommi Rumps, PA-C  predniSONE (DELTASONE) 10 MG tablet Take 6 tablets  today, on day 2 take 5 tablets, day 3 take 4 tablets, day 4 take 3 tablets, day 5 take  2 tablets and 1 tablet the last day 06/02/16   Tommi Rumps, PA-C    Allergies Patient has no known allergies.  Family History  Problem Relation Age of Onset  . Cancer Neg Hx   . Diabetes Neg Hx   . Heart failure Neg Hx     Social History Social History    Substance Use Topics  . Smoking status: Current Some Day Smoker    Packs/day: 0.50    Types: Cigarettes  . Smokeless tobacco: Never Used  . Alcohol use Yes    Review of Systems Constitutional: Positive fever/chills Eyes: No visual changes. ENT: Positive sore throat. Cardiovascular: Denies chest pain. Respiratory: Denies shortness of breath. Positive cough. Positive chest congestion. Gastrointestinal: No abdominal pain.  No nausea, no vomiting.   Musculoskeletal: Negative for back pain. Skin: Negative for rash. Neurological: Negative for headaches, focal weakness or numbness.  10-point ROS otherwise negative.  ____________________________________________   PHYSICAL EXAM:  VITAL SIGNS: ED Triage Vitals  Enc Vitals Group     BP 06/02/16 0837 129/69     Pulse Rate 06/02/16 0837 66     Resp 06/02/16 0837 16     Temp 06/02/16 0837 99 F (37.2 C)     Temp Source 06/02/16 0837 Oral     SpO2 06/02/16 0837 100 %     Weight 06/02/16 0838 191 lb (86.6 kg)     Height 06/02/16 0838 6' (1.829 m)     Head Circumference --      Peak Flow --      Pain Score 06/02/16 0843 7     Pain Loc --      Pain Edu? --      Excl. in GC? --  Constitutional: Alert and oriented. Well appearing and in no acute distress. Eyes: Conjunctivae are normal. PERRL. EOMI. Head: Atraumatic. Nose: Mild congestion/no rhinnorhea.  EACs and TMs are clear bilaterally. Mouth/Throat: Mucous membranes are moist.  Oropharynx non-erythematous. Mild posterior drainage present. Neck: No stridor.   Hematological/Lymphatic/Immunilogical: No cervical lymphadenopathy. Cardiovascular: Normal rate, regular rhythm. Grossly normal heart sounds.  Good peripheral circulation. Respiratory: Normal respiratory effort.  No retractions. Lungs CTAB. Coarse cough heard frequently. Musculoskeletal: No lower extremity tenderness nor edema.  No joint effusions. Neurologic:  Normal speech and language. No gross focal neurologic  deficits are appreciated.  Skin:  Skin is warm, dry and intact. No rash noted. Psychiatric: Mood and affect are normal. Speech and behavior are normal.  ____________________________________________   LABS (all labs ordered are listed, but only abnormal results are displayed)  Labs Reviewed  POCT RAPID STREP A    RADIOLOGY  Chest x-ray per radiologist is negative for cardiopulmonary disease. I, Tommi Rumps, personally viewed and evaluated these images (plain radiographs) as part of my medical decision making, as well as reviewing the written report by the radiologist. ____________________________________________   PROCEDURES  Procedure(s) performed: None  Procedures  Critical Care performed: No  ____________________________________________   INITIAL IMPRESSION / ASSESSMENT AND PLAN / ED COURSE  Pertinent labs & imaging results that were available during my care of the patient were reviewed by me and considered in my medical decision making (see chart for details).  Patient was given a DuoNeb treatment while in the department and improved with less coughing. Patient was reassessed and there was no wheezing noted. She was given a prescription for Zithromax, prednisone 60 mg 6 day taper and albuterol inhaler. He is to follow-up with [facility] clinic if any continued problems. We have also encouraged decreasing or stopping his smoking.      ____________________________________________   FINAL CLINICAL IMPRESSION(S) / ED DIAGNOSES  Final diagnoses:  Acute bronchitis, unspecified organism      NEW MEDICATIONS STARTED DURING THIS VISIT:  Discharge Medication List as of 06/02/2016 11:36 AM    START taking these medications   Details  albuterol (PROVENTIL HFA;VENTOLIN HFA) 108 (90 Base) MCG/ACT inhaler Inhale 2 puffs into the lungs every 6 (six) hours as needed for wheezing or shortness of breath., Starting Mon 06/02/2016, Print    azithromycin (ZITHROMAX Z-PAK)  250 MG tablet Take 2 tablets (500 mg) on  Day 1,  followed by 1 tablet (250 mg) once daily on Days 2 through 5., Print    predniSONE (DELTASONE) 10 MG tablet Take 6 tablets  today, on day 2 take 5 tablets, day 3 take 4 tablets, day 4 take 3 tablets, day 5 take  2 tablets and 1 tablet the last day, Print         Note:  This document was prepared using Dragon voice recognition software and may include unintentional dictation errors.    Tommi Rumps, PA-C 06/02/16 1902    Jene Every, MD 06/03/16 [phone]

## 2016-07-10 ENCOUNTER — Emergency Department
Admission: EM | Admit: 2016-07-10 | Discharge: 2016-07-10 | Disposition: A | Discharge status: Home / Self Care | Payer: Self-pay | Attending: Student in an Organized Health Care Education/Training Program | Admitting: Student in an Organized Health Care Education/Training Program

## 2016-07-10 ENCOUNTER — Encounter: Payer: Self-pay | Admitting: Emergency Medicine

## 2016-07-10 DIAGNOSIS — F17210 Nicotine dependence, cigarettes, uncomplicated: Secondary | ICD-10-CM | POA: Insufficient documentation

## 2016-07-10 DIAGNOSIS — J069 Acute upper respiratory infection, unspecified: Secondary | ICD-10-CM | POA: Insufficient documentation

## 2016-07-10 MED ORDER — GUAIFENESIN-CODEINE 100-10 MG/5ML PO SYRP: 5.0000 mL | ORAL_SOLUTION | Freq: Three times a day (TID) | ORAL | 0 refills | Status: DC | PRN

## 2016-07-10 MED ORDER — PREDNISONE 10 MG PO TABS: 50.0000 mg | ORAL_TABLET | Freq: Every day | ORAL | 0 refills | Status: DC

## 2016-07-10 MED ORDER — AMOXICILLIN 500 MG PO TABS: 500.0000 mg | ORAL_TABLET | Freq: Three times a day (TID) | ORAL | 0 refills | Status: DC

## 2016-07-10 NOTE — ED Triage Notes (Addendum)
Pt ambulatory to triage with steady gait, no distress noted. Pt c/o of cough, nasal congestion and sore throat since Monday. Pt denies SOB/N/V/fever.

## 2016-07-10 NOTE — ED Provider Notes (Signed)
[facility] Emergency Department Provider Note  ____________________________________________  Time seen: Approximately 7:19 AM  I have reviewed the triage vital signs and the nursing notes.   HISTORY  Chief Complaint Cough and Nasal Congestion   HPI Edgar Matthews is a 36 y.o. male who presents to the emergency department for evaluation of cough, nasal congestion, fever, and sore throatthat has progressively worsened over the past 4 days. He has taken over-the-counter cold medications without relief. He states he was treated 3 weeks ago for bronchitis which had completely resolved, but seems to have returned. He denies history of asthma, see the ED or emphysema but states that he does smoke about a half pack of cigarettes per day.   History reviewed. No pertinent past medical history.  There are no active problems to display for this patient.   Past Surgical History:  Procedure Laterality Date  . ABDOMINAL SURGERY    . APPENDECTOMY      Prior to Admission medications   Medication Sig Start Date End Date Taking? Authorizing Provider  albuterol (PROVENTIL HFA;VENTOLIN HFA) 108 (90 Base) MCG/ACT inhaler Inhale 2 puffs into the lungs every 6 (six) hours as needed for wheezing or shortness of breath. 06/02/16   Tommi RumpsSummers, Rhonda L, PA-C  amoxicillin (AMOXIL) 500 MG tablet Take 1 tablet (500 mg total) by mouth 3 (three) times daily. 07/10/16   Jerry Haugen B, FNP  guaiFENesin-codeine (ROBITUSSIN AC) 100-10 MG/5ML syrup Take 5 mLs by mouth 3 (three) times daily as needed for cough. 07/10/16   Jalaine Riggenbach B, FNP  predniSONE (DELTASONE) 10 MG tablet Take 5 tablets (50 mg total) by mouth daily. 07/10/16   Chinita Pesterriplett, Dane Kopke B, FNP    Allergies Patient has no known allergies.  Family History  Problem Relation Age of Onset  . Cancer Neg Hx   . Diabetes Neg Hx   . Heart failure Neg Hx     Social History Social History  Substance Use Topics  . Smoking status:  Current Some Day Smoker    Packs/day: 0.50    Types: Cigarettes  . Smokeless tobacco: Never Used  . Alcohol use Yes    Review of Systems Constitutional: Positive for fever/chills ENT: Positive for sore throat. Cardiovascular: Denies chest pain. Respiratory: Negative for shortness of breath. Positive for cough. Gastrointestinal: Negative for nausea,  negative for vomiting.  No diarrhea.  Musculoskeletal: Negative for body aches Skin: Negative for rash. Neurological: Negative for headaches ____________________________________________   PHYSICAL EXAM:  VITAL SIGNS: ED Triage Vitals [07/10/16 0655]  Enc Vitals Group     BP 124/73     Pulse Rate 88     Resp 16     Temp 98.2 F (36.8 C)     Temp Source Oral     SpO2 99 %     Weight 193 lb (87.5 kg)     Height 6' (1.829 m)     Head Circumference      Peak Flow      Pain Score      Pain Loc      Pain Edu?      Excl. in GC?     Constitutional: Alert and oriented. Well appearing and in no acute distress. Eyes: Conjunctivae are normal.  Ears: Bilateral tympanic membranes mildly erythematous, light reflex is intact Nose: Maxillary sinus congestion noted; no rhinnorhea. Mouth/Throat: Mucous membranes are moist.  Oropharynx erythematous. Tonsils not visualized. Neck: No stridor.  Lymphatic: No cervical lymphadenopathy. Cardiovascular: Normal rate, regular rhythm.  Good peripheral circulation. Respiratory: Normal respiratory effort.  No retractions. Faint expiratory wheezes noted on the left lobe. Gastrointestinal: Soft and nontender.  Musculoskeletal: FROM x 4 extremities.  Neurologic:  Normal speech and language.  Skin:  Skin is warm, dry and intact. No rash noted. Psychiatric: Mood and affect are normal. Speech and behavior are normal.  ____________________________________________   LABS (all labs ordered are listed, but only abnormal results are displayed)  Labs Reviewed - No data to  display ____________________________________________  EKG  Not indicated ____________________________________________  RADIOLOGY  Not indicated ____________________________________________   PROCEDURES  Procedure(s) performed: None  Critical Care performed: No ____________________________________________   INITIAL IMPRESSION / ASSESSMENT AND PLAN / ED COURSE  36 year old male presenting to the emergency department for treatment of cough and sore throat. Vital signs are reassuring. Due to his recent episode of bronchitis and report of fever up to 103 in addition to smoking, he'll be treated with antibiotics and steroids and advised to continue using his albuterol inhaler that he received 3 weeks ago. He was encouraged to establish primary care provider. He was instructed to return to the emergency department for symptoms that change or worsen if he is unable to schedule an appointment.  Pertinent labs & imaging results that were available during my care of the patient were reviewed by me and considered in my medical decision making (see chart for details).  New Prescriptions   AMOXICILLIN (AMOXIL) 500 MG TABLET    Take 1 tablet (500 mg total) by mouth 3 (three) times daily.   GUAIFENESIN-CODEINE (ROBITUSSIN AC) 100-10 MG/5ML SYRUP    Take 5 mLs by mouth 3 (three) times daily as needed for cough.   PREDNISONE (DELTASONE) 10 MG TABLET    Take 5 tablets (50 mg total) by mouth daily.    If controlled substance prescribed during this visit, 12 month history viewed on the [facility] prior to issuing an initial prescription for Schedule II or III opiod. ____________________________________________   FINAL CLINICAL IMPRESSION(S) / ED DIAGNOSES  Final diagnoses:  Upper respiratory tract infection, unspecified type    Note:  This document was prepared using Dragon voice recognition software and may include unintentional dictation errors.     Chinita Pester, FNP 07/10/16 6045     Willy Eddy, MD 07/11/16 [phone]

## 2016-10-05 ENCOUNTER — Emergency Department
Admission: EM | Admit: 2016-10-05 | Discharge: 2016-10-05 | Disposition: A | Discharge status: Home / Self Care | Payer: Self-pay | Attending: Emergency Medicine | Admitting: Emergency Medicine

## 2016-10-05 DIAGNOSIS — Y929 Unspecified place or not applicable: Secondary | ICD-10-CM | POA: Insufficient documentation

## 2016-10-05 DIAGNOSIS — X58XXXA Exposure to other specified factors, initial encounter: Secondary | ICD-10-CM | POA: Insufficient documentation

## 2016-10-05 DIAGNOSIS — Y939 Activity, unspecified: Secondary | ICD-10-CM | POA: Insufficient documentation

## 2016-10-05 DIAGNOSIS — F17210 Nicotine dependence, cigarettes, uncomplicated: Secondary | ICD-10-CM | POA: Insufficient documentation

## 2016-10-05 DIAGNOSIS — Y999 Unspecified external cause status: Secondary | ICD-10-CM | POA: Insufficient documentation

## 2016-10-05 DIAGNOSIS — S0500XA Injury of conjunctiva and corneal abrasion without foreign body, unspecified eye, initial encounter: Secondary | ICD-10-CM | POA: Insufficient documentation

## 2016-10-05 DIAGNOSIS — S0501XA Injury of conjunctiva and corneal abrasion without foreign body, right eye, initial encounter: Secondary | ICD-10-CM

## 2016-10-05 MED ORDER — FLUORESCEIN SODIUM 0.6 MG OP STRP
1.0000 | ORAL_STRIP | Freq: Once | OPHTHALMIC | Status: AC
  Administered 2016-10-05: 1 via OPHTHALMIC
  Filled 2016-10-05: qty 1

## 2016-10-05 MED ORDER — TOBRAMYCIN 0.3 % OP SOLN: 2.0000 [drp] | OPHTHALMIC | 0 refills | Status: DC

## 2016-10-05 MED ORDER — TETRACAINE HCL 0.5 % OP SOLN
1.0000 [drp] | Freq: Once | OPHTHALMIC | Status: AC
  Administered 2016-10-05: 1 [drp] via OPHTHALMIC
  Filled 2016-10-05: qty 4

## 2016-10-05 NOTE — ED Triage Notes (Signed)
Right eye redness and drainage. Vision normal. Pt alert and oriented X4, active, cooperative, pt in NAD. RR even and unlabored, color WNL.

## 2016-10-05 NOTE — ED Provider Notes (Signed)
[facility] Emergency Department Provider Note  ___________________________________________   First MD Initiated Contact with Patient 10/05/16 [phone]     (approximate)  I have reviewed the triage vital signs and the nursing notes.   HISTORY  Chief Complaint Conjunctivitis   HPI Edgar Matthews is a 36 y.o. male is here with complaint of right eye redness and drainage. Patient states that his cat slept on the right side near his head which normally does not happen. He woke up with his right eye slightly swollen. He denies any vision difficulties. Currently he does not have any drainage. There is still some slight redness. He rates his discomfort as a 1/10. He denies any photophobia, headache, nausea or vomiting.Patient does not wear contacts.  History reviewed. No pertinent past medical history.  There are no active problems to display for this patient.   Past Surgical History:  Procedure Laterality Date  . ABDOMINAL SURGERY    . APPENDECTOMY      Prior to Admission medications   Medication Sig Start Date End Date Taking? Authorizing Provider  albuterol (PROVENTIL HFA;VENTOLIN HFA) 108 (90 Base) MCG/ACT inhaler Inhale 2 puffs into the lungs every 6 (six) hours as needed for wheezing or shortness of breath. 06/02/16   Tommi Rumps, PA-C  tobramycin (TOBREX) 0.3 % ophthalmic solution Place 2 drops into the right eye every 4 (four) hours. 10/05/16   Tommi Rumps, PA-C    Allergies Patient has no known allergies.  Family History  Problem Relation Age of Onset  . Cancer Neg Hx   . Diabetes Neg Hx   . Heart failure Neg Hx     Social History Social History  Substance Use Topics  . Smoking status: Current Some Day Smoker    Packs/day: 0.50    Types: Cigarettes  . Smokeless tobacco: Never Used  . Alcohol use Yes    Review of Systems Constitutional: No fever/chills Eyes: Positive for redness and drainage. ENT: No sore  throat. Cardiovascular: Denies chest pain. Respiratory: Denies shortness of breath. Neurological: Negative for headaches ___________________________________________   PHYSICAL EXAM:  VITAL SIGNS: ED Triage Vitals [10/05/16 0746]  Enc Vitals Group     BP 118/67     Pulse Rate 72     Resp 18     Temp 98.9 F (37.2 C)     Temp Source Oral     SpO2 100 %     Weight 195 lb (88.5 kg)     Height 6' (1.829 m)     Head Circumference      Peak Flow      Pain Score 1     Pain Loc      Pain Edu?      Excl. in GC?    Constitutional: Alert and oriented. Well appearing and in no acute distress. Eyes: Right sclera mildly injected. Left sclera clear. Tetracaine was placed in the right eye. Lid was inverted with no foreign body noted. Fluorescein dye revealed a superficial abrasion at approximately 9:00 with scleral irritation. No foreign body. PERRL. EOMI. Head: Atraumatic. Nose: No congestion/rhinnorhea. Mouth/Throat: Mucous membranes are moist.  Oropharynx non-erythematous. Neck: No stridor.   Cardiovascular: Normal rate, regular rhythm. Grossly normal heart sounds.  Good peripheral circulation. Respiratory: Normal respiratory effort.  No retractions. Lungs CTAB. Musculoskeletal: Moves upper and lower extremities without any difficulty normal gait was noted. Neurologic:  Normal speech and language. No gross focal neurologic deficits are appreciated.  Skin:  Skin is warm,  dry and intact.  Psychiatric: Mood and affect are normal. Speech and behavior are normal.  ____________________________________________   LABS (all labs ordered are listed, but only abnormal results are displayed)  Labs Reviewed - No data to display   PROCEDURES  Procedure(s) performed: None  Procedures  Critical Care performed: No  ____________________________________________   INITIAL IMPRESSION / ASSESSMENT AND PLAN / ED COURSE  Pertinent labs & imaging results that were available during my care of  the patient were reviewed by me and considered in my medical decision making (see chart for details).  Patient is given a prescription for Tobrex ophthalmic solution to use every 4 hours while awake. He is to follow-up with [facility] if not improving in 24 hours. He may also take either Claritin, Zyrtec or Benadryl as needed for allergies since there is possibility that part of this could be a reaction from his cat sleeping next to his face. Visual acuity was recorded.   ____________________________________________   FINAL CLINICAL IMPRESSION(S) / ED DIAGNOSES  Final diagnoses:  Abrasion of right cornea, initial encounter      NEW MEDICATIONS STARTED DURING THIS VISIT:  Discharge Medication List as of 10/05/2016  8:54 AM       Note:  This document was prepared using Dragon voice recognition software and may include unintentional dictation errors.    Tommi Rumps, PA-C 10/05/16 1216    Jeanmarie Plant, MD 10/06/16 [phone]

## 2016-10-05 NOTE — Discharge Instructions (Signed)
Follow-up with [facility] if any continued problems and 24 hours. Begin using Tobrex ophthalmic solution 2 drops every 4 hours while awake. Take Benadryl as needed for allergies or if you have Claritin or Zyrtec he may take this instead.

## 2016-10-10 ENCOUNTER — Emergency Department: Payer: No Typology Code available for payment source

## 2016-10-10 ENCOUNTER — Encounter: Payer: Self-pay | Admitting: Emergency Medicine

## 2016-10-10 DIAGNOSIS — Y939 Activity, unspecified: Secondary | ICD-10-CM | POA: Insufficient documentation

## 2016-10-10 DIAGNOSIS — S32414A Nondisplaced fracture of anterior wall of right acetabulum, initial encounter for closed fracture: Secondary | ICD-10-CM | POA: Diagnosis not present

## 2016-10-10 DIAGNOSIS — F17210 Nicotine dependence, cigarettes, uncomplicated: Secondary | ICD-10-CM | POA: Diagnosis not present

## 2016-10-10 DIAGNOSIS — Y92410 Unspecified street and highway as the place of occurrence of the external cause: Secondary | ICD-10-CM | POA: Diagnosis not present

## 2016-10-10 DIAGNOSIS — S79911A Unspecified injury of right hip, initial encounter: Secondary | ICD-10-CM | POA: Diagnosis present

## 2016-10-10 DIAGNOSIS — Y999 Unspecified external cause status: Secondary | ICD-10-CM | POA: Insufficient documentation

## 2016-10-10 MED ORDER — OXYCODONE-ACETAMINOPHEN 5-325 MG PO TABS
1.0000 | ORAL_TABLET | ORAL | Status: DC | PRN
  Administered 2016-10-10: 1 via ORAL
  Filled 2016-10-10: qty 1
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
## 2016-10-11 ENCOUNTER — Emergency Department
Admission: EM | Admit: 2016-10-11 | Discharge: 2016-10-11 | Disposition: A | Discharge status: Home / Self Care | Payer: No Typology Code available for payment source | Attending: Emergency Medicine | Admitting: Emergency Medicine

## 2016-10-11 ENCOUNTER — Emergency Department: Payer: No Typology Code available for payment source

## 2016-10-11 DIAGNOSIS — S32414A Nondisplaced fracture of anterior wall of right acetabulum, initial encounter for closed fracture: Secondary | ICD-10-CM

## 2016-10-11 DIAGNOSIS — M25551 Pain in right hip: Secondary | ICD-10-CM

## 2016-10-11 MED ORDER — HYDROMORPHONE HCL 1 MG/ML IJ SOLN
1.0000 mg | Freq: Once | INTRAMUSCULAR | Status: AC
  Administered 2016-10-11: 1 mg via INTRAMUSCULAR
  Filled 2016-10-11: qty 1

## 2016-10-11 MED ORDER — OXYCODONE-ACETAMINOPHEN 5-325 MG PO TABS: 1.0000 | ORAL_TABLET | Freq: Four times a day (QID) | ORAL | 0 refills | Status: DC | PRN

## 2016-10-11 MED ORDER — OXYCODONE-ACETAMINOPHEN 5-325 MG PO TABS
1.0000 | ORAL_TABLET | Freq: Once | ORAL | Status: AC
  Administered 2016-10-11: 1 via ORAL
  Filled 2016-10-11: qty 1
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
## 2016-10-11 NOTE — ED Notes (Signed)
Cleaned pt's abrasions

## 2016-10-11 NOTE — ED Notes (Signed)

## 2016-10-11 NOTE — Discharge Instructions (Signed)
Please take your pain medication as needed, as prescribed. Please follow-up with orthopedics by calling the number provided Monday morning to arrange a follow-up appointment this coming week. Please use your crutches when walking. Return to the emergency department for any significant worsening pain, or any other symptom personally concerning to yourself.
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
## 2017-01-27 ENCOUNTER — Emergency Department: Payer: No Typology Code available for payment source

## 2017-01-27 ENCOUNTER — Emergency Department
Admission: EM | Admit: 2017-01-27 | Discharge: 2017-01-27 | Disposition: A | Discharge status: Home / Self Care | Payer: No Typology Code available for payment source | Attending: Emergency Medicine | Admitting: Emergency Medicine

## 2017-01-27 ENCOUNTER — Encounter: Payer: Self-pay | Admitting: Emergency Medicine

## 2017-01-27 DIAGNOSIS — Y999 Unspecified external cause status: Secondary | ICD-10-CM | POA: Insufficient documentation

## 2017-01-27 DIAGNOSIS — Y929 Unspecified place or not applicable: Secondary | ICD-10-CM | POA: Diagnosis not present

## 2017-01-27 DIAGNOSIS — S3992XA Unspecified injury of lower back, initial encounter: Secondary | ICD-10-CM | POA: Diagnosis present

## 2017-01-27 DIAGNOSIS — Y9389 Activity, other specified: Secondary | ICD-10-CM | POA: Insufficient documentation

## 2017-01-27 DIAGNOSIS — F17210 Nicotine dependence, cigarettes, uncomplicated: Secondary | ICD-10-CM | POA: Insufficient documentation

## 2017-01-27 DIAGNOSIS — Z79899 Other long term (current) drug therapy: Secondary | ICD-10-CM | POA: Diagnosis not present

## 2017-01-27 DIAGNOSIS — S300XXA Contusion of lower back and pelvis, initial encounter: Secondary | ICD-10-CM

## 2017-01-27 MED ORDER — IBUPROFEN 600 MG PO TABS: 600.0000 mg | ORAL_TABLET | Freq: Three times a day (TID) | ORAL | 0 refills | Status: DC | PRN

## 2017-01-27 MED ORDER — CYCLOBENZAPRINE HCL 10 MG PO TABS
10.0000 mg | ORAL_TABLET | Freq: Once | ORAL | Status: AC
  Administered 2017-01-27: 10 mg via ORAL
  Filled 2017-01-27: qty 1

## 2017-01-27 MED ORDER — OXYCODONE-ACETAMINOPHEN 5-325 MG PO TABS
1.0000 | ORAL_TABLET | Freq: Once | ORAL | Status: AC
  Administered 2017-01-27: 1 via ORAL
  Filled 2017-01-27: qty 1

## 2017-01-27 MED ORDER — TRAMADOL HCL 50 MG PO TABS: 50.0000 mg | ORAL_TABLET | Freq: Four times a day (QID) | ORAL | 0 refills | Status: DC | PRN

## 2017-01-27 MED ORDER — CYCLOBENZAPRINE HCL 10 MG PO TABS: 10.0000 mg | ORAL_TABLET | Freq: Three times a day (TID) | ORAL | 0 refills | Status: DC | PRN

## 2017-01-27 MED ORDER — IBUPROFEN 800 MG PO TABS
800.0000 mg | ORAL_TABLET | Freq: Once | ORAL | Status: AC
  Administered 2017-01-27: 800 mg via ORAL
  Filled 2017-01-27: qty 1
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
## 2017-10-29 ENCOUNTER — Emergency Department: Payer: Self-pay

## 2017-10-29 ENCOUNTER — Other Ambulatory Visit: Payer: Self-pay

## 2017-10-29 ENCOUNTER — Emergency Department
Admission: EM | Admit: 2017-10-29 | Discharge: 2017-10-29 | Disposition: A | Discharge status: Home / Self Care | Payer: Self-pay | Attending: Emergency Medicine | Admitting: Emergency Medicine

## 2017-10-29 DIAGNOSIS — M545 Low back pain, unspecified: Secondary | ICD-10-CM

## 2017-10-29 DIAGNOSIS — F17210 Nicotine dependence, cigarettes, uncomplicated: Secondary | ICD-10-CM | POA: Insufficient documentation

## 2017-10-29 LAB — URINALYSIS, COMPLETE (UACMP) WITH MICROSCOPIC
BACTERIA UA: NONE SEEN
Bilirubin Urine: NEGATIVE
Glucose, UA: NEGATIVE mg/dL
HGB URINE DIPSTICK: NEGATIVE
Ketones, ur: 5 mg/dL — AB
LEUKOCYTES UA: NEGATIVE
NITRITE: NEGATIVE
PH: 6 (ref 5.0–8.0)
Protein, ur: 30 mg/dL — AB
SPECIFIC GRAVITY, URINE: 1.033 — AB (ref 1.005–1.030)
SQUAMOUS EPITHELIAL / LPF: NONE SEEN (ref 0–5)

## 2017-10-29 MED ORDER — HYDROCODONE-ACETAMINOPHEN 5-325 MG PO TABS
1.0000 | ORAL_TABLET | Freq: Once | ORAL | Status: AC
  Administered 2017-10-29: 1 via ORAL
  Filled 2017-10-29: qty 1

## 2017-10-29 MED ORDER — KETOROLAC TROMETHAMINE 30 MG/ML IJ SOLN
30.0000 mg | Freq: Once | INTRAMUSCULAR | Status: AC
  Administered 2017-10-29: 30 mg via INTRAMUSCULAR
  Filled 2017-10-29: qty 1

## 2017-10-29 MED ORDER — BACLOFEN 10 MG PO TABS: 10.0000 mg | ORAL_TABLET | Freq: Three times a day (TID) | ORAL | 0 refills | Status: DC

## 2017-10-29 MED ORDER — METHOCARBAMOL 500 MG PO TABS
1000.0000 mg | ORAL_TABLET | Freq: Once | ORAL | Status: AC
  Administered 2017-10-29: 1000 mg via ORAL
  Filled 2017-10-29 (×2): qty 2

## 2017-10-29 MED ORDER — TRAMADOL HCL 50 MG PO TABS: 50.0000 mg | ORAL_TABLET | Freq: Four times a day (QID) | ORAL | 0 refills | Status: DC | PRN

## 2017-10-29 MED ORDER — PREDNISONE 10 MG PO TABS: ORAL_TABLET | ORAL | 0 refills | Status: DC

## 2017-10-29 NOTE — ED Triage Notes (Signed)
Pt arrives to ED alert, oriented, ambulatory to front desk. C/o low back pain x 2 days. Denies urinary symptoms. Denies injury or fall.

## 2017-10-29 NOTE — Discharge Instructions (Addendum)
Follow-up with Dr. Martha ClanKrasinski who is the orthopedist on call.  You will need to call make an appointment at his office.  Begin taking prednisone as a tapering dose beginning with 6 tablets today and tapering down by 1 tablet each day.  Tramadol is for pain 1 every 6 hours as needed.  Do not drive or operate machinery while taking this medication.  Baclofen is 3 times a day if needed for muscle spasms.  This medication could cause drowsiness and should not be taken if you are driving.  You may also use ice or heat to your back as needed for discomfort.  Return to the emergency department if any severe worsening of your symptoms such as loss of bowel or bladder function.

## 2017-10-29 NOTE — ED Notes (Signed)
See triage note  Presents with pain to lower back which is moving into right leg  States this has happened in past  But this pain is increased

## 2017-10-29 NOTE — ED Provider Notes (Signed)
[facility] Emergency Department Provider Note  ____________________________________________   First MD Initiated Contact with Patient 10/29/17 1418     (approximate)  I have reviewed the triage vital signs and the nursing notes.   HISTORY  Chief Complaint Back Pain   HPI Edgar Matthews is a 37 y.o. male presents to the emergency department with complaint of low back pain for 2 days which is causing some discomfort in his right leg.  Patient states he is been several years since he has had problems with his back.  He denies any recent injury for his current back pain.  He denies any urinary symptoms, incontinence of bowel or bladder or saddle anesthesias.  Patient has taken some over-the-counter medication without any relief.  Pain is an 8 out of 10.  History reviewed. No pertinent past medical history.  There are no active problems to display for this patient.   Past Surgical History:  Procedure Laterality Date  . ABDOMINAL SURGERY    . APPENDECTOMY      Prior to Admission medications   Medication Sig Start Date End Date Taking? Authorizing Provider  albuterol (PROVENTIL HFA;VENTOLIN HFA) 108 (90 Base) MCG/ACT inhaler Inhale 2 puffs into the lungs every 6 (six) hours as needed for wheezing or shortness of breath. 06/02/16   Tommi Rumps, PA-C  baclofen (LIORESAL) 10 MG tablet Take 1 tablet (10 mg total) by mouth 3 (three) times daily. 10/29/17   Tommi Rumps, PA-C  predniSONE (DELTASONE) 10 MG tablet Take 6 tablets  today, on day 2 take 5 tablets, day 3 take 4 tablets, day 4 take 3 tablets, day 5 take  2 tablets and 1 tablet the last day 10/29/17   Tommi Rumps, PA-C  traMADol (ULTRAM) 50 MG tablet Take 1 tablet (50 mg total) by mouth every 6 (six) hours as needed. 10/29/17   Tommi Rumps, PA-C    Allergies Patient has no known allergies.  Family History  Problem Relation Age of Onset  . Cancer Neg Hx   . Diabetes Neg Hx   . Heart  failure Neg Hx     Social History Social History   Tobacco Use  . Smoking status: Current Some Day Smoker    Packs/day: 0.50    Types: Cigarettes  . Smokeless tobacco: Never Used  Substance Use Topics  . Alcohol use: No  . Drug use: No    Review of Systems Constitutional: No fever/chills Cardiovascular: Denies chest pain. Respiratory: Denies shortness of breath. Gastrointestinal: No abdominal pain.  No nausea, no vomiting. Genitourinary: Negative for dysuria. Musculoskeletal: Positive for low back pain. Skin: Negative for rash. Neurological: Negative for headaches, focal weakness or numbness. ____________________________________________   PHYSICAL EXAM:  VITAL SIGNS: ED Triage Vitals  Enc Vitals Group     BP 10/29/17 1336 121/66     Pulse Rate 10/29/17 1336 76     Resp 10/29/17 1336 18     Temp 10/29/17 1336 98.5 F (36.9 C)     Temp Source 10/29/17 1336 Oral     SpO2 10/29/17 1336 99 %     Weight 10/29/17 1335 195 lb (88.5 kg)     Height 10/29/17 1335 6' (1.829 m)     Head Circumference --      Peak Flow --      Pain Score 10/29/17 1335 8     Pain Loc --      Pain Edu? --      Excl.  in GC? --    Constitutional: Alert and oriented. Well appearing and in no acute distress. Eyes: Conjunctivae are normal.  Head: Atraumatic. Neck: No stridor.   Cardiovascular: Normal rate, regular rhythm. Grossly normal heart sounds.  Good peripheral circulation. Respiratory: Normal respiratory effort.  No retractions. Lungs CTAB. Gastrointestinal: Soft and nontender. Musculoskeletal: Patient is lying on right side and has difficulty getting from this position into a sitting position for exam.  Patient is guarded in his movements.  There is no point tenderness on palpation of the thoracic or lumbar spine.  Straight leg raises are negative.  Good muscle strength bilaterally.  Patient is able to move upper and lower extremities without any difficulty. Neurologic:  Normal speech and  language. No gross focal neurologic deficits are appreciated.  Infra patella tendon reflexes are 1+ bilaterally.  No gait instability. Skin:  Skin is warm, dry and intact. No rash noted. Psychiatric: Mood and affect are normal. Speech and behavior are normal.  ____________________________________________   LABS (all labs ordered are listed, but only abnormal results are displayed)  Labs Reviewed  URINALYSIS, COMPLETE (UACMP) WITH MICROSCOPIC - Abnormal; Notable for the following components:      Result Value   Color, Urine AMBER (*)    APPearance CLEAR (*)    Specific Gravity, Urine 1.033 (*)    Ketones, ur 5 (*)    Protein, ur 30 (*)    All other components within normal limits   RADIOLOGY  ED MD interpretation:   Lumbar spine x-ray is negative for acute bony injury.  Official radiology report(s): Dg Lumbar Spine 2-3 Views  Result Date: 10/29/2017 CLINICAL DATA:  Lumbago with right-sided radicular symptoms EXAM: LUMBAR SPINE - 2-3 VIEW COMPARISON:  January 27, 2017 FINDINGS: Frontal, lateral, and spot lumbosacral lateral images were obtained. There are 5 non-rib-bearing lumbar type vertebral bodies. There is no fracture or spondylolisthesis. The disc spaces appear normal. No erosive change. Postoperative change noted in right abdomen. IMPRESSION: No fracture or spondylolisthesis.  No evident arthropathy. Electronically Signed   By: Bretta Bang III M.D.   On: 10/29/2017 15:25   ____________________________________________   PROCEDURES  Procedure(s) performed: None  Procedures  Critical Care performed: No  ____________________________________________   INITIAL IMPRESSION / ASSESSMENT AND PLAN / ED COURSE  As part of my medical decision making, I reviewed the following data within the electronic MEDICAL RECORD NUMBER Notes from prior ED visits and [place] Controlled Substance Database  Prior to x-rays patient was given Toradol 30 mg IM, Norco, and methocarbamol 1000 mg p.o.   Patient was more comfortable prior to discharge.  We discussed muscle spasms in his back discomfort.  He was discharged with prednisone 60 mg tapering dose, baclofen 10 mg 1 tablet 3 times daily for muscle spasms and tramadol 50 mg every 6 hours as needed for pain.  He is to follow-up with Dr. Martha Clan who is the orthopedist on call if any continued problems.  He is aware that he may return to the emergency department over the weekend if any severe worsening of his symptoms.  ____________________________________________   FINAL CLINICAL IMPRESSION(S) / ED DIAGNOSES  Final diagnoses:  Acute midline low back pain without sciatica     ED Discharge Orders         Ordered    predniSONE (DELTASONE) 10 MG tablet     10/29/17 1557    baclofen (LIORESAL) 10 MG tablet  3 times daily     10/29/17 1557  traMADol (ULTRAM) 50 MG tablet  Every 6 hours PRN     10/29/17 1557           Note:  This document was prepared using Dragon voice recognition software and may include unintentional dictation errors.    Tommi RumpsSummers, Rhonda L, PA-C 10/29/17 1615    Jeanmarie PlantMcShane, James A, MD 10/30/17 [phone]

## 2018-12-18 ENCOUNTER — Emergency Department
Admission: EM | Admit: 2018-12-18 | Discharge: 2018-12-18 | Disposition: A | Discharge status: Home / Self Care | Payer: BC Managed Care – PPO | Attending: Student | Admitting: Student

## 2018-12-18 ENCOUNTER — Emergency Department: Payer: Self-pay

## 2018-12-18 ENCOUNTER — Encounter: Payer: Self-pay | Admitting: Emergency Medicine

## 2018-12-18 ENCOUNTER — Emergency Department: Payer: Self-pay | Attending: Student

## 2018-12-18 ENCOUNTER — Other Ambulatory Visit: Payer: Self-pay

## 2018-12-18 DIAGNOSIS — Z79899 Other long term (current) drug therapy: Secondary | ICD-10-CM | POA: Insufficient documentation

## 2018-12-18 DIAGNOSIS — Y929 Unspecified place or not applicable: Secondary | ICD-10-CM | POA: Diagnosis not present

## 2018-12-18 DIAGNOSIS — Z20828 Contact with and (suspected) exposure to other viral communicable diseases: Secondary | ICD-10-CM | POA: Insufficient documentation

## 2018-12-18 DIAGNOSIS — M545 Low back pain: Secondary | ICD-10-CM | POA: Diagnosis not present

## 2018-12-18 DIAGNOSIS — W19XXXA Unspecified fall, initial encounter: Secondary | ICD-10-CM

## 2018-12-18 DIAGNOSIS — M25551 Pain in right hip: Secondary | ICD-10-CM | POA: Diagnosis not present

## 2018-12-18 DIAGNOSIS — Y999 Unspecified external cause status: Secondary | ICD-10-CM | POA: Diagnosis not present

## 2018-12-18 DIAGNOSIS — Y93H3 Activity, building and construction: Secondary | ICD-10-CM | POA: Diagnosis not present

## 2018-12-18 DIAGNOSIS — W132XXA Fall from, out of or through roof, initial encounter: Secondary | ICD-10-CM | POA: Diagnosis not present

## 2018-12-18 DIAGNOSIS — F17210 Nicotine dependence, cigarettes, uncomplicated: Secondary | ICD-10-CM | POA: Diagnosis not present

## 2018-12-18 MED ORDER — MORPHINE SULFATE (PF) 4 MG/ML IV SOLN
INTRAVENOUS | Status: AC
  Filled 2018-12-18: qty 1

## 2018-12-18 MED ORDER — HYDROCODONE-ACETAMINOPHEN 5-325 MG PO TABS: 1.0000 | ORAL_TABLET | Freq: Four times a day (QID) | ORAL | 0 refills | Status: AC | PRN

## 2018-12-18 MED ORDER — MORPHINE SULFATE (PF) 4 MG/ML IV SOLN
4.0000 mg | Freq: Once | INTRAVENOUS | Status: AC
  Administered 2018-12-18: 08:00:00 4 mg via INTRAVENOUS
  Filled 2018-12-18: qty 1

## 2018-12-18 MED ORDER — ONDANSETRON HCL 4 MG/2ML IJ SOLN
4.0000 mg | Freq: Once | INTRAMUSCULAR | Status: AC
  Administered 2018-12-18: 08:00:00 4 mg via INTRAVENOUS
  Filled 2018-12-18: qty 2

## 2018-12-18 MED ORDER — OXYCODONE-ACETAMINOPHEN 5-325 MG PO TABS: 1.0000 | ORAL_TABLET | Freq: Once | ORAL | Status: DC

## 2018-12-18 MED ORDER — MORPHINE SULFATE (PF) 4 MG/ML IV SOLN
4.0000 mg | Freq: Once | INTRAVENOUS | Status: AC
  Administered 2018-12-18: 4 mg via INTRAVENOUS

## 2018-12-18 MED ORDER — IBUPROFEN 600 MG PO TABS
600.0000 mg | ORAL_TABLET | Freq: Once | ORAL | Status: AC
  Administered 2018-12-18: 12:00:00 600 mg via ORAL
  Filled 2018-12-18: qty 1

## 2018-12-18 NOTE — ED Notes (Signed)
Patient transported to CT 

## 2018-12-18 NOTE — Discharge Instructions (Signed)
Thank you for letting us take care of you in the emergency department today.   Please continue to take any regular, prescribed medications.   Please take over the counter ibuprofen to help with muscle aches and pains. Use your prescription for pain you cannot control after using these medications.   New medications we have prescribed:  - Hydrocodone  Please follow up with: - Your primary care doctor to review your ER visit and follow up on your symptoms.   Please return to the ER for any new or worsening symptoms.
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
## 2018-12-18 NOTE — ED Triage Notes (Addendum)
Pt fell 13-15 feet off roof about 45 minutes ago. Severe right hip pain, previous fx before to right hip. Lower back pain. Denies LOC. Brought straight to room.

## 2018-12-19 LAB — NOVEL CORONAVIRUS, NAA (HOSP ORDER, SEND-OUT TO REF LAB; TAT 18-24 HRS): SARS-CoV-2, NAA: NOT DETECTED

## 2019-01-07 ENCOUNTER — Encounter: Payer: Self-pay | Admitting: Emergency Medicine

## 2019-01-07 ENCOUNTER — Other Ambulatory Visit: Payer: Self-pay

## 2019-01-07 ENCOUNTER — Emergency Department
Admission: EM | Admit: 2019-01-07 | Discharge: 2019-01-07 | Disposition: A | Discharge status: Home / Self Care | Payer: BC Managed Care – PPO | Attending: Student | Admitting: Student

## 2019-01-07 ENCOUNTER — Emergency Department: Payer: BC Managed Care – PPO

## 2019-01-07 DIAGNOSIS — B356 Tinea cruris: Secondary | ICD-10-CM | POA: Insufficient documentation

## 2019-01-07 DIAGNOSIS — N433 Hydrocele, unspecified: Secondary | ICD-10-CM | POA: Diagnosis not present

## 2019-01-07 DIAGNOSIS — F17210 Nicotine dependence, cigarettes, uncomplicated: Secondary | ICD-10-CM | POA: Diagnosis not present

## 2019-01-07 DIAGNOSIS — N50819 Testicular pain, unspecified: Secondary | ICD-10-CM

## 2019-01-07 DIAGNOSIS — N50811 Right testicular pain: Secondary | ICD-10-CM | POA: Diagnosis present

## 2019-01-07 LAB — URINALYSIS, ROUTINE W REFLEX MICROSCOPIC
Bilirubin Urine: NEGATIVE
Glucose, UA: NEGATIVE mg/dL
Hgb urine dipstick: NEGATIVE
Ketones, ur: NEGATIVE mg/dL
Leukocytes,Ua: NEGATIVE
Nitrite: NEGATIVE
Protein, ur: NEGATIVE mg/dL
Specific Gravity, Urine: 1.012 (ref 1.005–1.030)
pH: 5 (ref 5.0–8.0)

## 2019-01-07 MED ORDER — CLOTRIMAZOLE 1 % EX CREA: 1.0000 "application " | TOPICAL_CREAM | Freq: Two times a day (BID) | CUTANEOUS | 0 refills | Status: AC

## 2019-01-07 NOTE — ED Notes (Signed)
See triage note  Presents with rash to scrotal area for several month  States rash is now spreading  Now having pain to right testicle area  Denies any urinary sxs'

## 2019-01-07 NOTE — Discharge Instructions (Signed)
Thank you for letting us take care of you in the emergency department today.   Please continue to take any regular, prescribed medications.   New medications we have prescribed:  -  Clotrimazole cream  Please follow up with: - Your primary care doctor to review your ER visit and follow up on your symptoms.   Please return to the ER for any new or worsening symptoms.

## 2019-01-07 NOTE — ED Triage Notes (Signed)
Pt here with c/o pain in right testicle for the past few days, has not happened before, appears in pain in triage. Described as "constant pressure, but today mainly sharp, shooting pain."

## 2019-01-07 NOTE — ED Provider Notes (Signed)
[facility] Emergency Department Provider Note  ____________________________________________   First MD Initiated Contact with Patient 01/07/19 1232     (approximate)  I have reviewed the triage vital signs and the nursing notes.  History  Chief Complaint Testicular pain    HPI Edgar Matthews is a 38 y.o. male who presents to the emergency department for right-sided testicular pain as well as rash to the right-sided groin area/upper thigh.    Patient reports he has had a rash to the right proximal, medial thigh for several weeks now, has recently started to involve his groin area.  He has been applying steroid cream, with no significant improvement. The rash does not involve his penis or glans.   Over the last several days he has developed right-sided testicular pain, that he describes as a pressure type sensation, constant. No trauma or injuries.  Today the pain became more severe and felt more sharp, prompting evaluation.  Pain does not radiate, 7/10 in severity, no alleviating/aggravating factors.  He denies any dysuria or penile discharge.  He denies any concern for STD and states he is not sexually active.  He denies any fevers or chills.   Past Medical Hx History reviewed. No pertinent past medical history.  Problem List There are no active problems to display for this patient.   Past Surgical Hx Past Surgical History:  Procedure Laterality Date  . ABDOMINAL SURGERY    . APPENDECTOMY      Medications Prior to Admission medications   Medication Sig Start Date End Date Taking? Authorizing Provider  clotrimazole (LOTRIMIN) 1 % cream Apply 1 application topically 2 (two) times daily for 28 days. 01/07/19 02/04/19  Miguel Aschoff., MD    Allergies Patient has no known allergies.  Family Hx Family History  Problem Relation Age of Onset  . Cancer Neg Hx   . Diabetes Neg Hx   . Heart failure Neg Hx     Social Hx Social History   Tobacco  Use  . Smoking status: Current Some Day Smoker    Packs/day: 0.50    Types: Cigarettes  . Smokeless tobacco: Never Used  Substance Use Topics  . Alcohol use: No  . Drug use: No     Review of Systems  Constitutional: Negative for fever, chills. Eyes: Negative for visual changes. ENT: Negative for sore throat. Cardiovascular: Negative for chest pain. Respiratory: Negative for shortness of breath. Gastrointestinal: Negative for nausea, vomiting.  Genitourinary: Negative for dysuria. + R testicular pain Musculoskeletal: Negative for leg swelling. Skin: + for rash. Neurological: Negative for for headaches.   Physical Exam  Vital Signs: ED Triage Vitals  Enc Vitals Group     BP 01/07/19 1220 122/74     Pulse Rate 01/07/19 1220 73     Resp 01/07/19 1220 18     Temp 01/07/19 1220 98.8 F (37.1 C)     Temp Source 01/07/19 1220 Oral     SpO2 01/07/19 1220 100 %     Weight 01/07/19 1221 168 lb (76.2 kg)     Height 01/07/19 1221 5\' 11"  (1.803 m)     Head Circumference --      Peak Flow --      Pain Score 01/07/19 1220 7     Pain Loc --      Pain Edu? --      Excl. in [place]? --     Constitutional: Alert and oriented.  Head: Normocephalic. Atraumatic. Eyes: Conjunctivae clear. Sclera  anicteric. Nose: No congestion. No rhinorrhea. Mouth/Throat: Wearing mask.  Neck: No stridor.   Cardiovascular: Normal rate, regular rhythm. Extremities well perfused. Respiratory: Normal respiratory effort.  Lungs CTAB. Gastrointestinal: Soft. Non-tender. Non-distended.  Genitourinary: RN chaperone present.  See skin below.  Testes of normal lie.  No testicular swelling, erythema, or tenderness.  Small nodule palpated on the right, presumed hydrocele.  No rashes or lesions on the shaft or glans.  No discharge. Musculoskeletal: No lower extremity edema. No deformities. Neurologic:  Normal speech and language. No gross focal neurologic deficits are appreciated.  Skin: Tinea cruris to the proximal  medial thigh and involving the groin region - erythematous patch with some central clearing, sharply demarcated, erythematous, raised border. Psychiatric: Mood and affect are appropriate for situation.  EKG  N/A   Radiology  [place]:  IMPRESSION:  1. Both testes appear normal with normal blood flow. No evidence of  testicular torsion.  2. Small right-sided hydrocele.    Procedures  Procedure(s) performed (including critical care):  Procedures   Initial Impression / Assessment and Plan / ED Course  38 y.o. male who presents to the ED for R sided testicular pain x several days, as well as rash for several weeks.   Ddx: tinea cruris, hydrocele, varicocele, torsion, STD  UA negative for infection, GC/chlamydia sent.  Ultrasound negative for torsion.  Small right-sided hydrocele, which is consistent with exam.  As such, will plan for discharge with Rx for clotrimazole cream and advised outpatient follow-up.  Patient voices understanding and is comfortable to plan and discharge.   Final Clinical Impression(s) / ED Diagnosis  Final diagnoses:  Tinea cruris  Hydrocele in adult       Note:  This document was prepared using Dragon voice recognition software and may include unintentional dictation errors.   Lilia Pro., MD 01/07/19 2132

## 2019-01-12 LAB — GC/CHLAMYDIA PROBE AMP
Chlamydia trachomatis, NAA: NEGATIVE
Neisseria Gonorrhoeae by PCR: NEGATIVE

## 2019-10-26 ENCOUNTER — Other Ambulatory Visit: Payer: Self-pay

## 2019-10-26 ENCOUNTER — Emergency Department
Admission: EM | Admit: 2019-10-26 | Discharge: 2019-10-26 | Disposition: A | Discharge status: Home / Self Care | Payer: BC Managed Care – PPO | Attending: Emergency Medicine | Admitting: Emergency Medicine

## 2019-10-26 DIAGNOSIS — L989 Disorder of the skin and subcutaneous tissue, unspecified: Secondary | ICD-10-CM | POA: Diagnosis not present

## 2019-10-26 DIAGNOSIS — F17210 Nicotine dependence, cigarettes, uncomplicated: Secondary | ICD-10-CM | POA: Insufficient documentation

## 2019-10-26 MED ORDER — CEPHALEXIN 500 MG PO CAPS: 500.0000 mg | ORAL_CAPSULE | Freq: Four times a day (QID) | ORAL | 0 refills | Status: AC

## 2019-10-26 NOTE — ED Triage Notes (Signed)
Pt arrives via pov from home with c/o sore on right side of face. Pt states it felt like a pimple initially, he reports that he did press on the area, then with he woke up he noticed wound the size of a dime. Center appears scabbed, edge of puss around outside of wound. NAD noted at this time

## 2019-10-26 NOTE — ED Provider Notes (Signed)
[facility] Emergency Department Provider Note  ____________________________________________  Time seen: Approximately 9:31 AM  I have reviewed the triage vital signs and the nursing notes.   HISTORY  Chief Complaint No chief complaint on file.    HPI Edgar Matthews is a 39 y.o. male that presents to the emergency department for evaluation of lesion to the right cheek for 1 day. Patient states that area started out as a black dot and this morning has grown. It is tender to touch. No drainage. He was taking trash out yesterday and unsure if he may have touched something and then touched his face. He was also working outside. He does not recall being bitten by any insects. No tick bites. He has never had a cold sore. He uses a beard oil and occasionally gets pimples on his face. No fevers.   History reviewed. No pertinent past medical history.  There are no problems to display for this patient.   Past Surgical History:  Procedure Laterality Date  . ABDOMINAL SURGERY    . APPENDECTOMY      Prior to Admission medications   Medication Sig Start Date End Date Taking? Authorizing Provider  cephALEXin (KEFLEX) 500 MG capsule Take 1 capsule (500 mg total) by mouth 4 (four) times daily for 10 days. 10/26/19 11/05/19  Enid Derry, PA-C    Allergies Patient has no known allergies.  Family History  Problem Relation Age of Onset  . Cancer Neg Hx   . Diabetes Neg Hx   . Heart failure Neg Hx     Social History Social History   Tobacco Use  . Smoking status: Current Some Day Smoker    Packs/day: 0.50    Types: Cigarettes  . Smokeless tobacco: Never Used  Substance Use Topics  . Alcohol use: No  . Drug use: No     Review of Systems  Constitutional: No fever/chills Cardiovascular: No chest pain. Respiratory: No SOB. Gastrointestinal:  No nausea, no vomiting.  Musculoskeletal: Negative for musculoskeletal pain. Skin: Negative for abrasions,  lacerations, ecchymosis. Positive for rash.   ____________________________________________   PHYSICAL EXAM:  VITAL SIGNS: ED Triage Vitals  Enc Vitals Group     BP 10/26/19 0711 117/70     Pulse Rate 10/26/19 0711 (!) 58     Resp 10/26/19 0711 16     Temp 10/26/19 0711 98.2 F (36.8 C)     Temp src --      SpO2 10/26/19 0711 96 %     Weight 10/26/19 0712 200 lb (90.7 kg)     Height 10/26/19 0712 5' 10.5" (1.791 m)     Head Circumference --      Peak Flow --      Pain Score 10/26/19 0712 7     Pain Loc --      Pain Edu? --      Excl. in [place]? --      Constitutional: Alert and oriented. Well appearing and in no acute distress. Eyes: Conjunctivae are normal. PERRL. EOMI. Head: Atraumatic. ENT:      Ears:      Nose: No congestion/rhinnorhea.      Mouth/Throat: Mucous membranes are moist.  Neck: No stridor. Cardiovascular: Normal rate, regular rhythm.  Good peripheral circulation. Respiratory: Normal respiratory effort without tachypnea or retractions. Lungs CTAB. Good air entry to the bases with no decreased or absent breath sounds. Musculoskeletal: Full range of motion to all extremities. No gross deformities appreciated. Neurologic:  Normal speech and  language. No gross focal neurologic deficits are appreciated.  Skin:  Skin is warm, dry. Annular honey crusted lesion with central darkened area to right cheek proximal to the nose. Mild surrounding erythema. Mild tenderness to palpation. No active drainage. No fluctuance. Psychiatric: Mood and affect are normal. Speech and behavior are normal. Patient exhibits appropriate insight and judgement.   ____________________________________________   LABS (all labs ordered are listed, but only abnormal results are displayed)  Labs Reviewed - No data to display ____________________________________________  EKG   ____________________________________________  RADIOLOGY   No results  found.  ____________________________________________    PROCEDURES  Procedure(s) performed:    Procedures    Medications - No data to display   ____________________________________________   INITIAL IMPRESSION / ASSESSMENT AND PLAN / ED COURSE  Pertinent labs & imaging results that were available during my care of the patient were reviewed by me and considered in my medical decision making (see chart for details).  Review of the [place] CSRS was performed in accordance of the [facility] prior to dispensing any controlled drugs.    Patient's diagnosis is consistent with staph infection. Vital signs and exam are reassuring. Lesion is most consistent with a staph infection. It does not appear entirely consistent with a herpes lesion. Patient has never had a herpes outbreak before. Patient will be discharged home with prescriptions for Keflex. Patient is to follow up with primary care dermatology as directed. Patient is given ED precautions to return to the ED for any worsening or new symptoms.   Edgar Matthews was evaluated in Emergency Department on 10/26/2019 for the symptoms described in the history of present illness. He was evaluated in the context of the global COVID-19 pandemic, which necessitated consideration that the patient might be at risk for infection with the SARS-CoV-2 virus that causes COVID-19. Institutional protocols and algorithms that pertain to the evaluation of patients at risk for COVID-19 are in a state of rapid change based on information released by regulatory bodies including the CDC and federal and state organizations. These policies and algorithms were followed during the patient's care in the ED.  ____________________________________________  FINAL CLINICAL IMPRESSION(S) / ED DIAGNOSES  Final diagnoses:  Face lesion      NEW MEDICATIONS STARTED DURING THIS VISIT:  ED Discharge Orders         Ordered    cephALEXin (KEFLEX) 500 MG capsule  4 times daily         10/26/19 0919              This chart was dictated using voice recognition software/Dragon. Despite best efforts to proofread, errors can occur which can change the meaning. Any change was purely unintentional.    Enid Derry, PA-C 10/26/19 1057    Sharman Cheek, MD 10/26/19 1415

## 2019-10-26 NOTE — ED Notes (Signed)
See triage note  Presents with possible insect/bug bite to right cheek   Area is tender and has some pus around the edges

## 2020-02-27 ENCOUNTER — Encounter: Payer: Self-pay | Admitting: Emergency Medicine

## 2020-02-27 ENCOUNTER — Emergency Department
Admission: EM | Admit: 2020-02-27 | Discharge: 2020-02-27 | Disposition: A | Discharge status: Home / Self Care | Payer: BC Managed Care – PPO | Attending: Emergency Medicine | Admitting: Emergency Medicine

## 2020-02-27 ENCOUNTER — Other Ambulatory Visit: Payer: Self-pay

## 2020-02-27 DIAGNOSIS — U071 COVID-19: Secondary | ICD-10-CM | POA: Diagnosis not present

## 2020-02-27 DIAGNOSIS — F17210 Nicotine dependence, cigarettes, uncomplicated: Secondary | ICD-10-CM | POA: Diagnosis not present

## 2020-02-27 DIAGNOSIS — R52 Pain, unspecified: Secondary | ICD-10-CM | POA: Diagnosis present

## 2020-02-27 DIAGNOSIS — B349 Viral infection, unspecified: Secondary | ICD-10-CM

## 2020-02-27 LAB — SARS CORONAVIRUS 2 (TAT 6-24 HRS): SARS Coronavirus 2: POSITIVE — AB

## 2020-02-27 MED ORDER — IBUPROFEN 600 MG PO TABS: 600.0000 mg | ORAL_TABLET | Freq: Three times a day (TID) | ORAL | 0 refills | Status: DC | PRN

## 2020-02-27 MED ORDER — PSEUDOEPH-BROMPHEN-DM 30-2-10 MG/5ML PO SYRP: 5.0000 mL | ORAL_SOLUTION | Freq: Four times a day (QID) | ORAL | 0 refills | Status: DC | PRN

## 2020-02-27 MED ORDER — KETOROLAC TROMETHAMINE 60 MG/2ML IM SOLN
60.0000 mg | Freq: Once | INTRAMUSCULAR | Status: AC
  Administered 2020-02-27: 60 mg via INTRAMUSCULAR
  Filled 2020-02-27: qty 2

## 2020-02-27 NOTE — ED Provider Notes (Signed)
[facility] Emergency Department Provider Note   ____________________________________________   Event Date/Time   First MD Initiated Contact with Patient 02/27/20 1002     (approximate)  I have reviewed the triage vital signs and the nursing notes.   HISTORY  Chief Complaint Generalized Body Aches and Chills    HPI Edgar Matthews is a 40 y.o. male patient presents with 3 days of chills, body aches, nasal congestion and coughing.  Patient state he had a fever of 103 2 days ago.  Patient also complained of frontal headache.  Patient denies loss of taste or smell.  Patient denies recent travel or known contact with COVID-19.  Patient has not taken the COVID-19 vaccine or flu shot.  Patient rates his pain as a 4/10.  Patient scribed pain is "achy".  No palliative measures for complaint.         History reviewed. No pertinent past medical history.  There are no problems to display for this patient.   Past Surgical History:  Procedure Laterality Date  . ABDOMINAL SURGERY    . APPENDECTOMY      Prior to Admission medications   Medication Sig Start Date End Date Taking? Authorizing Provider  brompheniramine-pseudoephedrine-DM 30-2-10 MG/5ML syrup Take 5 mLs by mouth 4 (four) times daily as needed. 02/27/20  Yes Joni Reining, PA-C  ibuprofen (ADVIL) 600 MG tablet Take 1 tablet (600 mg total) by mouth every 8 (eight) hours as needed. 02/27/20  Yes Joni Reining, PA-C    Allergies Patient has no known allergies.  Family History  Problem Relation Age of Onset  . Cancer Neg Hx   . Diabetes Neg Hx   . Heart failure Neg Hx     Social History Social History   Tobacco Use  . Smoking status: Current Some Day Smoker    Packs/day: 0.50    Types: Cigarettes  . Smokeless tobacco: Never Used  Substance Use Topics  . Alcohol use: No  . Drug use: No    Review of Systems Constitutional: No fever/chills.  Body aches. Eyes: No visual changes. ENT:  No sore throat.  Nasal congestion Cardiovascular: Denies chest pain. Respiratory: Denies shortness of breath.  Nonproductive cough Gastrointestinal: No abdominal pain.  No nausea, no vomiting.  No diarrhea.  No constipation. Genitourinary: Negative for dysuria. Musculoskeletal: Negative for back pain. Skin: Negative for rash. Neurological: Positive for headaches, denies focal weakness or numbness.   ____________________________________________   PHYSICAL EXAM:  VITAL SIGNS: ED Triage Vitals   Enc Vitals Group     BP 131/79     Pulse Rate 65     Resp 18     Temp 98.7 F (37.1 C)     Temp Source Oral     SpO2 100 %     Weight 175 lb (79.4 kg)     Height 6' (1.829 m)     Head Circumference      Peak Flow      Pain Score 4     Pain Loc      Pain Edu?      Excl. in GC?     Constitutional: Alert and oriented. Well appearing and in no acute distress. Eyes: Conjunctivae are normal. PERRL. EOMI. Head: Atraumatic. Nose: Edematous nasal turbinates. Mouth/Throat: Mucous membranes are moist.  Oropharynx non-erythematous.  Postnasal drainage. Neck: No stridor.  Hematological/Lymphatic/Immunilogical: No cervical lymphadenopathy. Cardiovascular: Normal rate, regular rhythm. Grossly normal heart sounds.  Good peripheral circulation. Respiratory: Normal respiratory effort.  No retractions. Lungs CTAB. Gastrointestinal: Soft and nontender. No distention. No abdominal bruits. No CVA tenderness. Neurologic:  Normal speech and language. No gross focal neurologic deficits are appreciated. No gait instability. Skin:  Skin is warm, dry and intact. No rash noted. Psychiatric: Mood and affect are normal. Speech and behavior are normal.  ____________________________________________   LABS (all labs ordered are listed, but only abnormal results are displayed)  Labs Reviewed  SARS CORONAVIRUS 2 (TAT 6-24 HRS)    ____________________________________________  EKG   ____________________________________________  RADIOLOGY I, Joni Reining, personally viewed and evaluated these images (plain radiographs) as part of my medical decision making, as well as reviewing the written report by the radiologist.  ED MD interpretation:    Official radiology report(s): No results found.  ____________________________________________   PROCEDURES  Procedure(s) performed (including Critical Care):  Procedures   ____________________________________________   INITIAL IMPRESSION / ASSESSMENT AND PLAN / ED COURSE  As part of my medical decision making, I reviewed the following data within the electronic MEDICAL RECORD NUMBER         Patient presents with 3 days of body aches, headache, and nasal congestion.  Patient also has a nonproductive cough.  Patient complaint physical exam consistent with viral illness.  Patient given discharge care instructions.  Patient advised self quarantine pending results of COVID-19 test.  Patient advised test is positive was quarantine additional 10 days.  Take medication as directed.      ____________________________________________   FINAL CLINICAL IMPRESSION(S) / ED DIAGNOSES  Final diagnoses:  Viral illness     ED Discharge Orders         Ordered    brompheniramine-pseudoephedrine-DM 30-2-10 MG/5ML syrup  4 times daily PRN        02/27/20 1016    ibuprofen (ADVIL) 600 MG tablet  Every 8 hours PRN        02/27/20 1016          *Please note:  Edgar Matthews was evaluated in Emergency Department on 02/27/2020 for the symptoms described in the history of present illness. He was evaluated in the context of the global COVID-19 pandemic, which necessitated consideration that the patient might be at risk for infection with the SARS-CoV-2 virus that causes COVID-19. Institutional protocols and algorithms that pertain to the evaluation of patients at risk for  COVID-19 are in a state of rapid change based on information released by regulatory bodies including the CDC and federal and state organizations. These policies and algorithms were followed during the patient's care in the ED.  Some ED evaluations and interventions may be delayed as a result of limited staffing during and the pandemic.*   Note:  This document was prepared using Dragon voice recognition software and may include unintentional dictation errors.    Joni Reining, PA-C 02/27/20 1016    Dionne Bucy, MD 02/27/20 1500

## 2020-02-27 NOTE — ED Triage Notes (Addendum)
Patient with complaint of chills and body aches that started on Saturday. Patient reports that he had a fever of 103 on Saturday. Patient also states that he has headaches, heightened sense of taste and smell.

## 2020-02-27 NOTE — Discharge Instructions (Signed)
Follow discharge care instruction take medication as directed.  Advised self quarantine pending results of COVID-19 test.  Test results will be available later today in the MyChart app.  If positive was quarantine additional 10 days.

## 2020-06-16 IMAGING — US US SCROTUM W/ DOPPLER COMPLETE
1 series · 14 of 25 positions shown · non-contrast
Comparison: None.

CLINICAL DATA: Right testicular pain for 3 days.

EXAM:
SCROTAL ULTRASOUND
DOPPLER ULTRASOUND OF THE TESTICLES
TECHNIQUE: Complete ultrasound examination of the testicles, epididymis, and
other scrotal structures was performed. Color and spectral Doppler
ultrasound were also utilized to evaluate blood flow to the
testicles.

[Series 1: us scrotum w/ doppler complete · 0.08mm/px · 68 acquisitions, 14 frames shown]
[im 1/68]
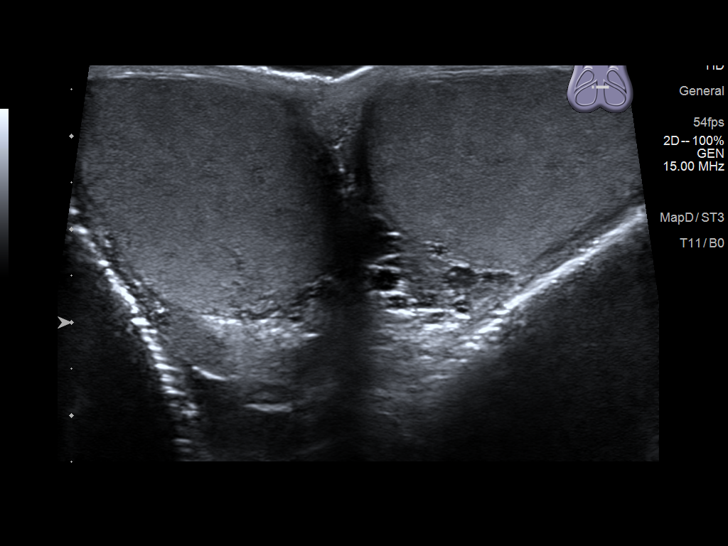
[im 6/68]
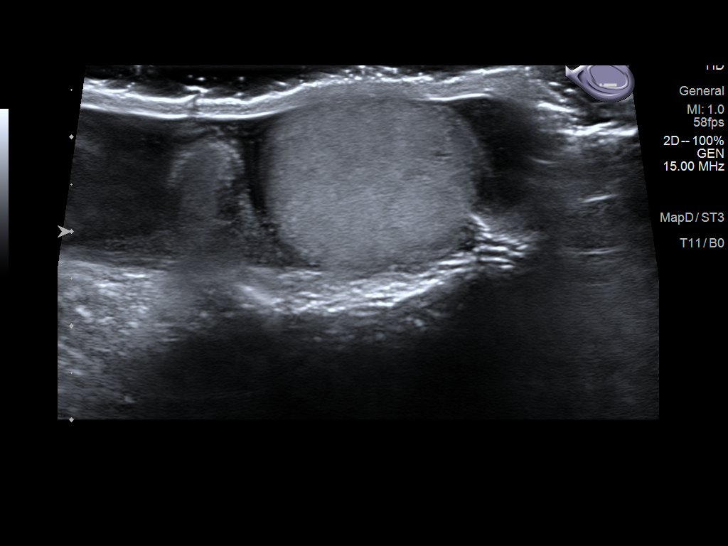
[im 12/68]
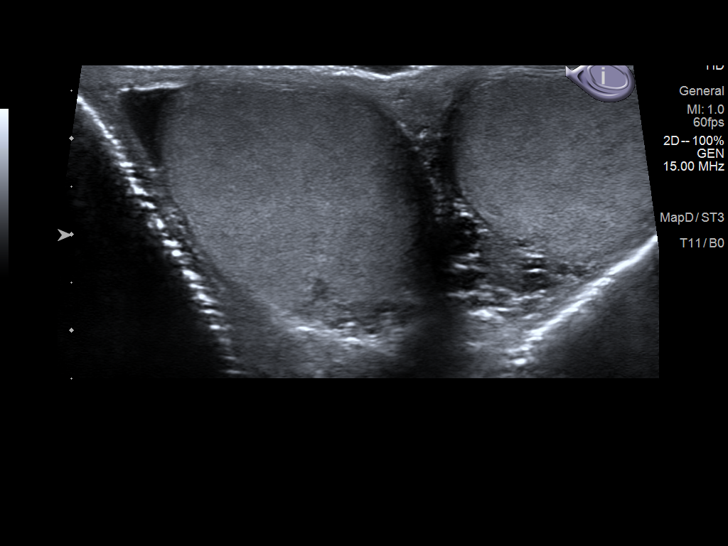
[im 17/68]
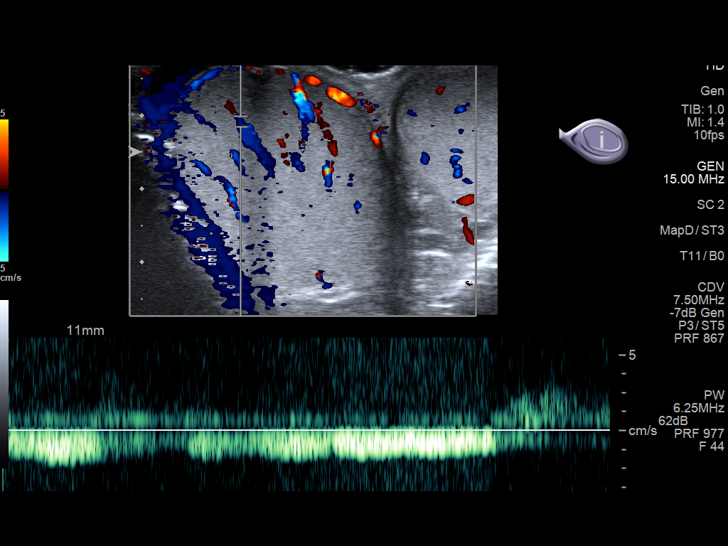
[im 23/68]
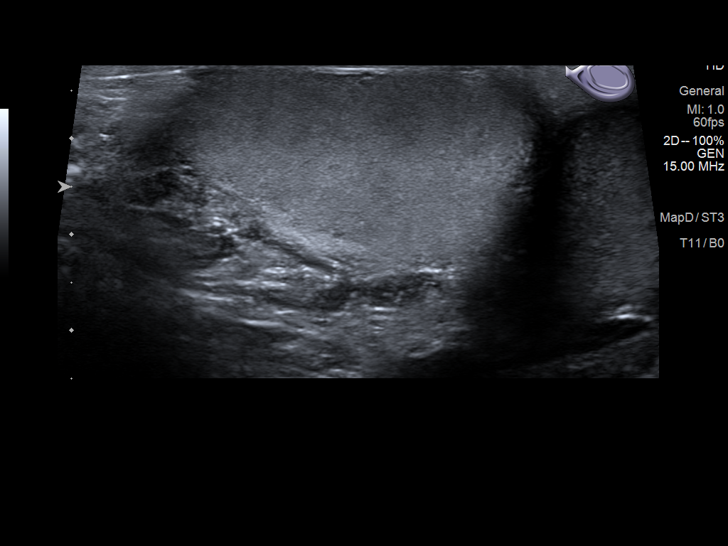
[im 26/68]
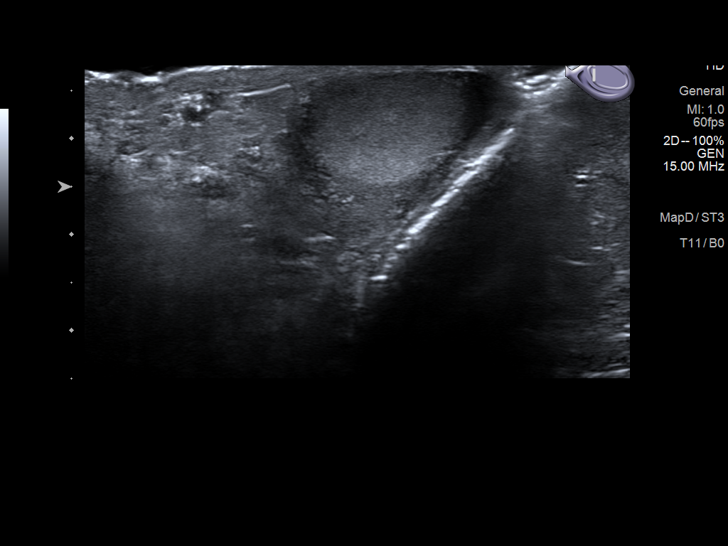
[im 31/68]
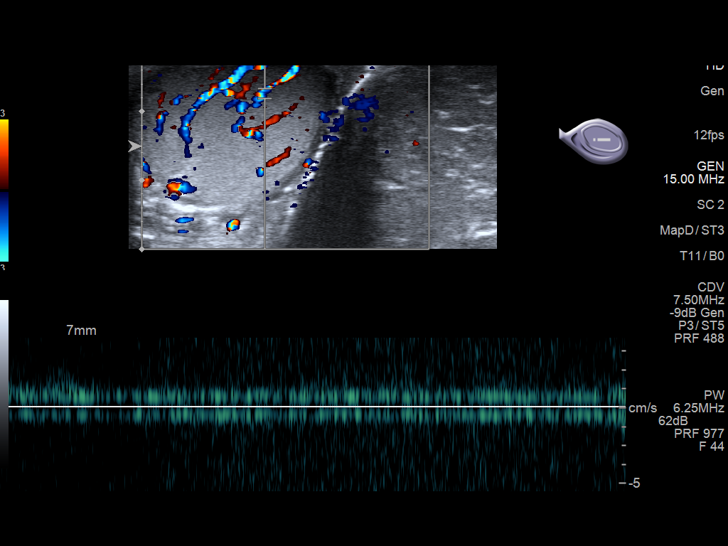
[im 37/68]
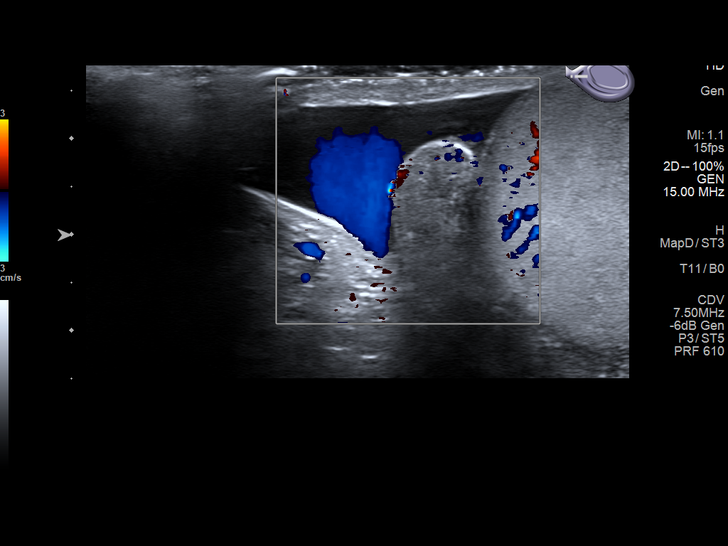
[im 42/68]
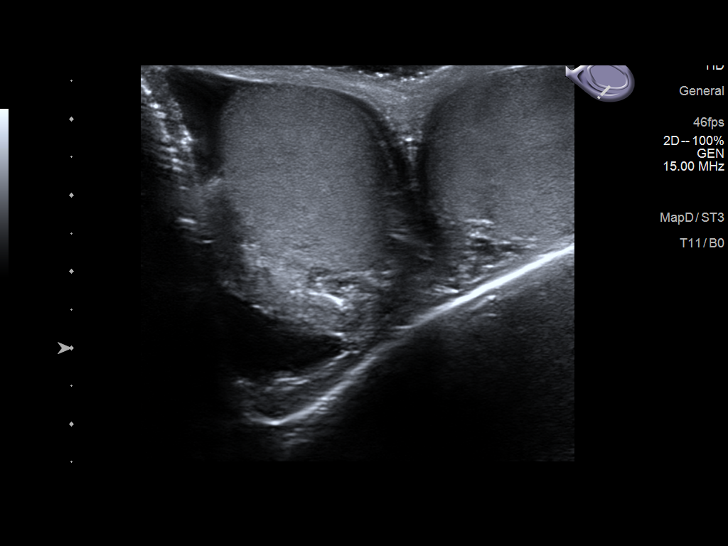
[im 45/68]
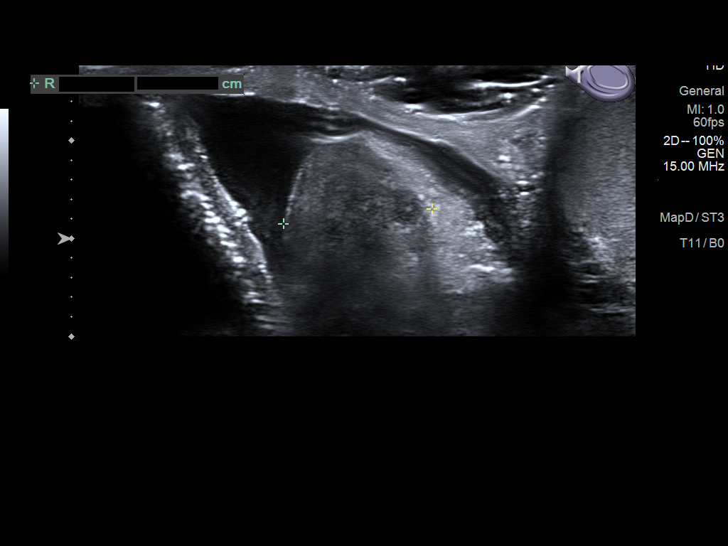
[im 51/68]
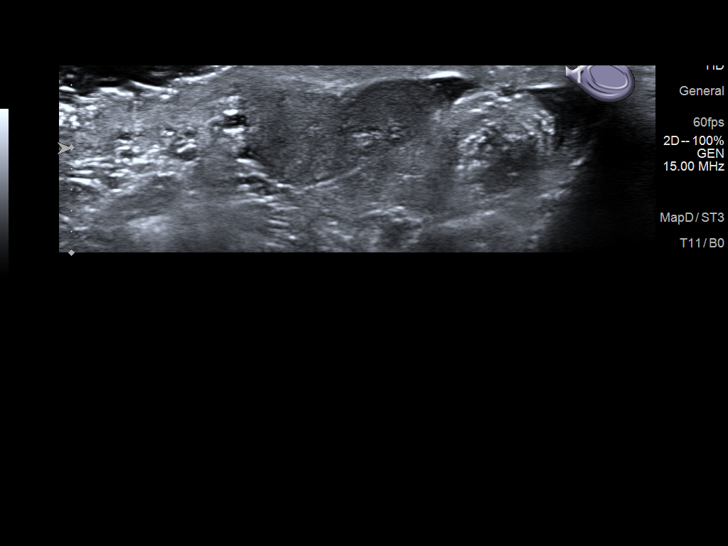
[im 56/68]
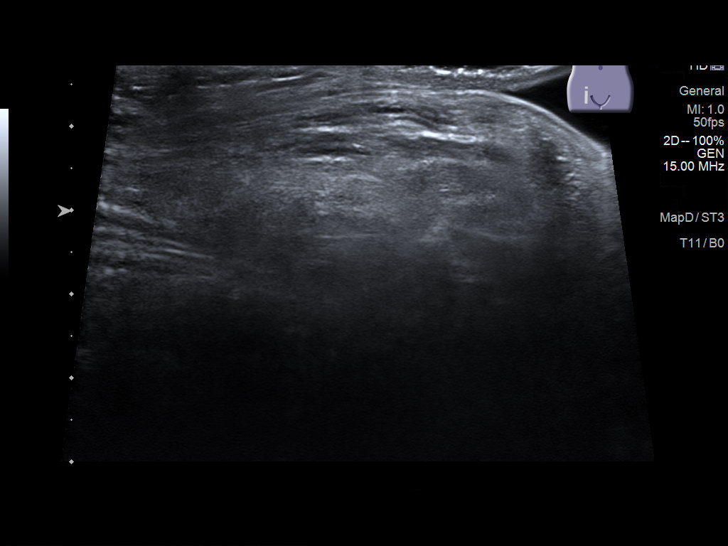
[im 62/68]
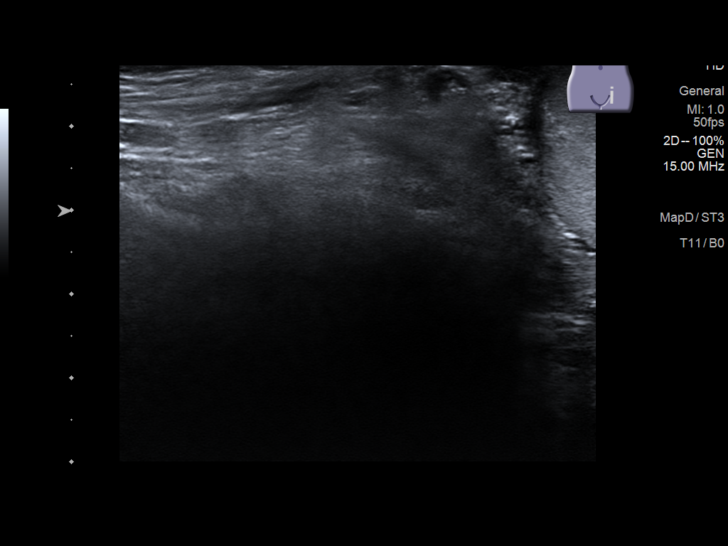
[im 68/68]
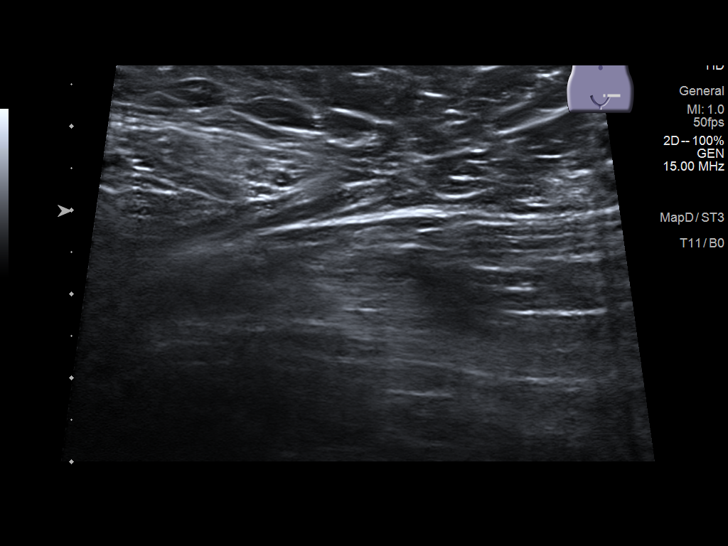

[14 of 25 positions shown; findings below may reference images not displayed]

FINDINGS: Right testicle

Measurements: 4.1 x 2.6 x 3.1 cm. No mass or microlithiasis
visualized.

Left testicle

Measurements: 4.9 x 2.3 x 3.0 cm. No mass or microlithiasis
visualized.

Right epididymis: Tiny epididymal cyst or spermatocele. Otherwise
unremarkable.

Left epididymis:  Normal in size and appearance.

Hydrocele:  Small right-sided hydrocele.

Varicocele:  None visualized.

Pulsed Doppler interrogation of both testes demonstrates normal low
resistance arterial and venous waveforms bilaterally.
IMPRESSION: 1. Both testes appear normal with normal blood flow. No evidence of
testicular torsion.
2. Small right-sided hydrocele.

## 2021-02-12 ENCOUNTER — Other Ambulatory Visit: Payer: Self-pay

## 2021-02-12 ENCOUNTER — Encounter: Payer: Self-pay | Admitting: Emergency Medicine

## 2021-02-12 ENCOUNTER — Emergency Department: Payer: Self-pay

## 2021-02-12 DIAGNOSIS — K2900 Acute gastritis without bleeding: Secondary | ICD-10-CM | POA: Insufficient documentation

## 2021-02-12 DIAGNOSIS — F17210 Nicotine dependence, cigarettes, uncomplicated: Secondary | ICD-10-CM | POA: Insufficient documentation

## 2021-02-12 DIAGNOSIS — K5900 Constipation, unspecified: Secondary | ICD-10-CM | POA: Insufficient documentation

## 2021-02-12 DIAGNOSIS — K8020 Calculus of gallbladder without cholecystitis without obstruction: Secondary | ICD-10-CM | POA: Insufficient documentation

## 2021-02-12 LAB — URINALYSIS, ROUTINE W REFLEX MICROSCOPIC
Bacteria, UA: NONE SEEN
Bilirubin Urine: NEGATIVE
Glucose, UA: NEGATIVE mg/dL
Ketones, ur: NEGATIVE mg/dL
Leukocytes,Ua: NEGATIVE
Nitrite: NEGATIVE
Protein, ur: NEGATIVE mg/dL
Specific Gravity, Urine: 1.017 (ref 1.005–1.030)
pH: 5 (ref 5.0–8.0)

## 2021-02-12 LAB — CBC
HCT: 44.2 % (ref 39.0–52.0)
Hemoglobin: 14.7 g/dL (ref 13.0–17.0)
MCH: 32.6 pg (ref 26.0–34.0)
MCHC: 33.3 g/dL (ref 30.0–36.0)
MCV: 98 fL (ref 80.0–100.0)
Platelets: 153 10*3/uL (ref 150–400)
RBC: 4.51 MIL/uL (ref 4.22–5.81)
RDW: 12.8 % (ref 11.5–15.5)
WBC: 4.3 10*3/uL (ref 4.0–10.5)
nRBC: 0 % (ref 0.0–0.2)

## 2021-02-12 LAB — COMPREHENSIVE METABOLIC PANEL
ALT: 20 U/L (ref 0–44)
AST: 25 U/L (ref 15–41)
Albumin: 4.1 g/dL (ref 3.5–5.0)
Alkaline Phosphatase: 37 U/L — ABNORMAL LOW (ref 38–126)
Anion gap: 6 (ref 5–15)
BUN: 13 mg/dL (ref 6–20)
CO2: 27 mmol/L (ref 22–32)
Calcium: 9.2 mg/dL (ref 8.9–10.3)
Chloride: 105 mmol/L (ref 98–111)
Creatinine, Ser: 1.16 mg/dL (ref 0.61–1.24)
GFR, Estimated: >60 mL/min (ref >60)
Glucose, Bld: 97 mg/dL (ref 70–99)
Potassium: 3.8 mmol/L (ref 3.5–5.1)
Sodium: 138 mmol/L (ref 135–145)
Total Bilirubin: 0.6 mg/dL (ref 0.3–1.2)
Total Protein: 7.1 g/dL (ref 6.5–8.1)

## 2021-02-12 LAB — LIPASE, BLOOD: Lipase: 33 U/L (ref 11–51)

## 2021-02-12 LAB — TROPONIN I (HIGH SENSITIVITY): Troponin I (High Sensitivity): 5 ng/L (ref <18)

## 2021-02-12 NOTE — ED Provider Notes (Signed)
°  Emergency Medicine Provider Triage Evaluation Note  Darnelle Corp, a 40 y.o. male  was evaluated in triage.  Pt complains of upper and left lower back abdominal pain intermittently for the last 3 weeks.  Patient reports some referred pain into the chest.  He reports fever last week 103 F.  Denies any recent nausea or vomiting.  Patient gives remote history of colon resection surgery 20 years prior.  Review of Systems  Positive: LLQ Abd pain Negative: Vomiting/diarrhea  Physical Exam  BP 116/71    Pulse 63    Temp 98.3 F (36.8 C) (Oral)    Resp 20    Ht 5\' 11"  (1.803 m)    Wt 74.8 kg    SpO2 99%    BMI 23.01 kg/m  Gen:   Awake, no distress  NAD Resp:  Normal effort CTA MSK:   Moves extremities without difficulty  Other:  ABD: soft, nontender  Medical Decision Making  Medically screening exam initiated at 7:52 PM.  Appropriate orders placed.  was informed that the remainder of the evaluation will be completed by another provider, this initial triage assessment does not replace that evaluation, and the importance of remaining in the ED until their evaluation is complete.  Patient with ED evaluation of 3 weeks of intermittent epigastric and left lower quadrant abdominal pain with associated nausea.   Eber Jones, PA-C 02/12/21 2027    2028, MD 02/12/21 2040

## 2021-02-12 NOTE — ED Triage Notes (Signed)
Pt to ED from home c/o upper and lower left abd pain x3 weeks getting worse tonight and states pain radiates into chest.  States nausea without vom or diarrhea, fever of 103 at home, tylenol last taken yesterday morning.  Denies urinary changes.  Pt A&Ox4, chest rise even and unlabored, skin WNL and in NAD at this time.

## 2021-02-13 ENCOUNTER — Emergency Department
Admission: EM | Admit: 2021-02-13 | Discharge: 2021-02-13 | Disposition: A | Discharge status: Home / Self Care | Payer: Self-pay | Attending: Emergency Medicine | Admitting: Emergency Medicine

## 2021-02-13 DIAGNOSIS — K8020 Calculus of gallbladder without cholecystitis without obstruction: Secondary | ICD-10-CM

## 2021-02-13 DIAGNOSIS — K2900 Acute gastritis without bleeding: Secondary | ICD-10-CM

## 2021-02-13 DIAGNOSIS — R1012 Left upper quadrant pain: Secondary | ICD-10-CM

## 2021-02-13 DIAGNOSIS — K5900 Constipation, unspecified: Secondary | ICD-10-CM

## 2021-02-13 LAB — TROPONIN I (HIGH SENSITIVITY): Troponin I (High Sensitivity): 4 ng/L (ref <18)

## 2021-02-13 MED ORDER — DULCOLAX 5 MG PO TBEC: 5.0000 mg | DELAYED_RELEASE_TABLET | Freq: Every day | ORAL | 1 refills | Status: AC | PRN

## 2021-02-13 MED ORDER — OMEPRAZOLE MAGNESIUM 20 MG PO TBEC: 20.0000 mg | DELAYED_RELEASE_TABLET | Freq: Every day | ORAL | 1 refills | Status: DC

## 2021-02-13 MED ORDER — LIDOCAINE VISCOUS HCL 2 % MT SOLN: 15.0000 mL | OROMUCOSAL | 0 refills | Status: DC | PRN

## 2021-02-13 NOTE — ED Provider Notes (Signed)
[facility] Emergency Department Provider Note   ____________________________________________   Event Date/Time   First MD Initiated Contact with Patient 02/13/21 0745     (approximate)  I have reviewed the triage vital signs and the nursing notes.   HISTORY  Chief Complaint Abdominal Pain    HPI Edgar Matthews is a 40 y.o. male with no significant past medical history presents to the ED complaining of abdominal pain.  Patient reports that he has had 3 weeks of gradually worsening pain throughout his abdomen diffusely, greatest on the left side.  Pain is worse when he goes to eat, when it becomes a severe squeezing and throbbing discomfort.  He denies any associated nausea or vomiting, does state that his appetite has been significantly affected.  He denies any diarrhea and has not noticed any blood in his stool, does state that bowel movements have been small and hard recently.  He has been able to tolerate liquids but reports significant difficulty with solids due to the discomfort.  Discomfort were also sometimes to move upwards into his chest.  He denies any associated fevers, cough, or shortness of breath.  He states he has been trying to establish care with a PCP but was referred to the ED for further evaluation.        History reviewed. No pertinent past medical history.  There are no problems to display for this patient.   Past Surgical History:  Procedure Laterality Date   ABDOMINAL SURGERY     APPENDECTOMY      Prior to Admission medications   Medication Sig Start Date End Date Taking? Authorizing Provider  bisacodyl (DULCOLAX) 5 MG EC tablet Take 1 tablet (5 mg total) by mouth daily as needed for moderate constipation. 02/13/21 02/13/22 Yes Chesley Noon, MD  lidocaine (XYLOCAINE) 2 % solution Use as directed 15 mLs in the mouth or throat as needed for mouth pain. 02/13/21  Yes Chesley Noon, MD  omeprazole (PRILOSEC OTC) 20 MG tablet  Take 1 tablet (20 mg total) by mouth daily. 02/13/21 02/13/22 Yes Chesley Noon, MD  brompheniramine-pseudoephedrine-DM 30-2-10 MG/5ML syrup Take 5 mLs by mouth 4 (four) times daily as needed. 02/27/20   Joni Reining, PA-C  ibuprofen (ADVIL) 600 MG tablet Take 1 tablet (600 mg total) by mouth every 8 (eight) hours as needed. 02/27/20   Joni Reining, PA-C    Allergies Patient has no known allergies.  Family History  Problem Relation Age of Onset   Cancer Neg Hx    Diabetes Neg Hx    Heart failure Neg Hx     Social History Social History   Tobacco Use   Smoking status: Some Days    Packs/day: 0.50    Types: Cigarettes   Smokeless tobacco: Never  Substance Use Topics   Alcohol use: Yes   Drug use: No    Review of Systems  Constitutional: No fever/chills Eyes: No visual changes. ENT: No sore throat. Cardiovascular: Positive for chest pain. Respiratory: Denies shortness of breath. Gastrointestinal: Positive for abdominal pain abdominal pain.  No nausea, no vomiting.  No diarrhea.  Positive for constipation. Genitourinary: Negative for dysuria. Musculoskeletal: Negative for back pain. Skin: Negative for rash. Neurological: Negative for headaches, focal weakness or numbness.  ____________________________________________   PHYSICAL EXAM:  VITAL SIGNS: ED Triage Vitals  Enc Vitals Group     BP 02/12/21 1924 116/71     Pulse Rate 02/12/21 1924 63     Resp 02/12/21 1924  20     Temp 02/12/21 1924 98.3 F (36.8 C)     Temp Source 02/12/21 1924 Oral     SpO2 02/12/21 1924 99 %     Weight 02/12/21 1931 165 lb (74.8 kg)     Height 02/12/21 1931 5\' 11"  (1.803 m)     Head Circumference --      Peak Flow --      Pain Score 02/12/21 1930 5     Pain Loc --      Pain Edu? --      Excl. in GC? --     Constitutional: Alert and oriented. Eyes: Conjunctivae are normal. Head: Atraumatic. Nose: No congestion/rhinnorhea. Mouth/Throat: Mucous membranes are moist. Neck:  Normal ROM Cardiovascular: Normal rate, regular rhythm. Grossly normal heart sounds.  2+ radial pulses bilaterally. Respiratory: Normal respiratory effort.  No retractions. Lungs CTAB. Gastrointestinal: Soft and nontender. No distention. Genitourinary: deferred Musculoskeletal: No lower extremity tenderness nor edema. Neurologic:  Normal speech and language. No gross focal neurologic deficits are appreciated. Skin:  Skin is warm, dry and intact. No rash noted. Psychiatric: Mood and affect are normal. Speech and behavior are normal.  ____________________________________________   LABS (all labs ordered are listed, but only abnormal results are displayed)  Labs Reviewed  COMPREHENSIVE METABOLIC PANEL - Abnormal; Notable for the following components:      Result Value   Alkaline Phosphatase 37 (*)    All other components within normal limits  URINALYSIS, ROUTINE W REFLEX MICROSCOPIC - Abnormal; Notable for the following components:   Color, Urine YELLOW (*)    APPearance CLEAR (*)    Hgb urine dipstick SMALL (*)    All other components within normal limits  LIPASE, BLOOD  CBC  TROPONIN I (HIGH SENSITIVITY)  TROPONIN I (HIGH SENSITIVITY)   ____________________________________________  EKG  ED ECG REPORT I, 02/14/21, the attending physician, personally viewed and interpreted this ECG.   Date: 02/13/2021  EKG Time: 19:33  Rate: 54  Rhythm: sinus bradycardia  Axis: Normal  Intervals:none  ST&T Change: None   PROCEDURES  Procedure(s) performed (including Critical Care):  Procedures   ____________________________________________   INITIAL IMPRESSION / ASSESSMENT AND PLAN / ED COURSE      40 year old male with no significant past medical history presents to the ED with 3 weeks of gradually worsening abdominal pain that is worse when he eats and associated with constipation.  Patient has no abdominal tenderness on exam and labs are unremarkable, LFTs and lipase  within normal limits.  CT scan is remarkable for cholelithiasis without evidence of cholecystitis, no other apparent cause for patient's symptoms.  Clinically, his symptoms sound most consistent with gastritis and GERD along with component of constipation.  Cardiac work-up was performed due to pain moving upwards into his chest, EKG shows no evidence of arrhythmia or ischemia and 2 sets of troponin are negative.  Chest x-ray reviewed by me and shows no infiltrate, edema, or effusion.  He will occasionally have pain in his right upper quadrant and due to presence of gallstones, patient provided referral to follow-up outpatient with general surgery.  We will start on PPI and viscous lidocaine for use as needed for suspected gastritis, patient also prescribed Dulcolax for use as needed.  He was counseled on over-the-counter bowel regimen and counseled to establish care with PCP.  Patient counseled to return to the ED for new worsening symptoms, patient agrees with plan.      ____________________________________________   FINAL CLINICAL IMPRESSION(S) /  ED DIAGNOSES  Final diagnoses:  Acute gastritis without hemorrhage, unspecified gastritis type  Calculus of gallbladder without cholecystitis without obstruction  Left upper quadrant abdominal pain  Constipation, unspecified constipation type     ED Discharge Orders          Ordered    omeprazole (PRILOSEC OTC) 20 MG tablet  Daily        02/13/21 0813    lidocaine (XYLOCAINE) 2 % solution  As needed        02/13/21 0813    bisacodyl (DULCOLAX) 5 MG EC tablet  Daily PRN        02/13/21 0813             Note:  This document was prepared using Dragon voice recognition software and may include unintentional dictation errors.    Chesley Noon, MD 02/13/21 [phone]

## 2021-02-13 NOTE — Discharge Instructions (Signed)

## 2021-02-13 NOTE — ED Notes (Signed)
Pt states he is still having pain   updated on wait

## 2021-05-06 ENCOUNTER — Other Ambulatory Visit: Payer: Self-pay

## 2021-05-06 ENCOUNTER — Encounter: Payer: Self-pay | Admitting: Emergency Medicine

## 2021-05-06 ENCOUNTER — Emergency Department
Admission: EM | Admit: 2021-05-06 | Discharge: 2021-05-06 | Disposition: A | Discharge status: Home / Self Care | Payer: Self-pay | Attending: Emergency Medicine | Admitting: Emergency Medicine

## 2021-05-06 DIAGNOSIS — R339 Retention of urine, unspecified: Secondary | ICD-10-CM | POA: Insufficient documentation

## 2021-05-06 DIAGNOSIS — M546 Pain in thoracic spine: Secondary | ICD-10-CM | POA: Insufficient documentation

## 2021-05-06 DIAGNOSIS — G8929 Other chronic pain: Secondary | ICD-10-CM | POA: Insufficient documentation

## 2021-05-06 DIAGNOSIS — R109 Unspecified abdominal pain: Secondary | ICD-10-CM | POA: Insufficient documentation

## 2021-05-06 LAB — URINALYSIS, COMPLETE (UACMP) WITH MICROSCOPIC
Bacteria, UA: NONE SEEN
Bilirubin Urine: NEGATIVE
Glucose, UA: NEGATIVE mg/dL
Hgb urine dipstick: NEGATIVE
Ketones, ur: 5 mg/dL — AB
Leukocytes,Ua: NEGATIVE
Nitrite: NEGATIVE
Protein, ur: NEGATIVE mg/dL
Specific Gravity, Urine: 1.025 (ref 1.005–1.030)
Squamous Epithelial / HPF: NONE SEEN (ref 0–5)
pH: 5 (ref 5.0–8.0)

## 2021-05-06 LAB — BASIC METABOLIC PANEL
Anion gap: 6 (ref 5–15)
BUN: 14 mg/dL (ref 6–20)
CO2: 29 mmol/L (ref 22–32)
Calcium: 9 mg/dL (ref 8.9–10.3)
Chloride: 104 mmol/L (ref 98–111)
Creatinine, Ser: 0.93 mg/dL (ref 0.61–1.24)
GFR, Estimated: >60 mL/min (ref >60)
Glucose, Bld: 132 mg/dL — ABNORMAL HIGH (ref 70–99)
Potassium: 4 mmol/L (ref 3.5–5.1)
Sodium: 139 mmol/L (ref 135–145)

## 2021-05-06 MED ORDER — NAPROXEN 500 MG PO TABS: 500.0000 mg | ORAL_TABLET | Freq: Two times a day (BID) | ORAL | 2 refills | Status: DC

## 2021-05-06 NOTE — ED Provider Notes (Signed)
? ?[facility] ?Provider Note ? ? ? Event Date/Time  ? First MD Initiated Contact with Patient 05/06/21 [phone]   ?  (approximate) ? ? ?History  ? ?Urinary Retention ? ? ?HPI ? ?Barnes Florek is a 41 y.o. male who reports back pain for the last 18 months, he describes it as in the middle of his back.  He reports it is worse with movement.  He had always attributed it to having a bad back but was recently reading on the Internet that his pain is in the location of his kidneys.  He thinks that he may be having kidney problems and is worried about his kidney function because he does not urinate as often as he thinks he should.  No flank pain, no dysuria ?  ? ? ?Physical Exam  ? ?Triage Vital Signs: ?ED Triage Vitals  ?Enc Vitals Group  ?   BP 05/06/21 0706 (!) 138/100  ?   Pulse Rate 05/06/21 0706 (!) 54  ?   Resp 05/06/21 0706 20  ?   Temp 05/06/21 0706 98.4 ?F (36.9 ?C)  ?   Temp Source 05/06/21 0706 Oral  ?   SpO2 05/06/21 0706 100 %  ?   Weight 05/06/21 0707 70.3 kg (155 lb)  ?   Height 05/06/21 0707 1.803 m (5\' 11" )  ?   Head Circumference --   ?   Peak Flow --   ?   Pain Score 05/06/21 0707 7  ?   Pain Loc --   ?   Pain Edu? --   ?   Excl. in GC? --   ? ? ?Most recent vital signs: ?Vitals:  ? 05/06/21 0706  ?BP: (!) 138/100  ?Pulse: (!) 54  ?Resp: 20  ?Temp: 98.4 ?F (36.9 ?C)  ?SpO2: 100%  ? ? ? ?General: Awake, no distress.  ?CV:  Good peripheral perfusion.  ?Resp:  Normal effort.  ?Abd:  No distention.  ?Other:  Back: No vertebral tenderness palpation, mild paraspinal tenderness at approximately T10 bilaterally ? ? ?ED Results / Procedures / Treatments  ? ?Labs ?(all labs ordered are listed, but only abnormal results are displayed) ?Labs Reviewed  ?BASIC METABOLIC PANEL - Abnormal; Notable for the following components:  ?    Result Value  ? Glucose, Bld 132 (*)   ? All other components within normal limits  ?URINALYSIS, COMPLETE (UACMP) WITH MICROSCOPIC - Abnormal; Notable for the following  components:  ? Color, Urine YELLOW (*)   ? APPearance CLEAR (*)   ? Ketones, ur 5 (*)   ? All other components within normal limits  ? ? ? ?EKG ? ? ? ? ?RADIOLOGY ? ? ? ? ?PROCEDURES: ? ?Critical Care performed:  ? ?Procedures ? ? ?MEDICATIONS ORDERED IN ED: ?Medications - No data to display ? ? ?IMPRESSION / MDM / ASSESSMENT AND PLAN / ED COURSE  ?I reviewed the triage vital signs and the nursing notes. ? ?Patient presents with back pain as detailed above.  Reviewed lab work from 02/12/2021 which demonstrated normal kidney function. ? ?I doubt his pain is from kidneys, more likely to be musculoskeletal back pain.  However we will obtain BMP and urinalysis ? ? ?BMP and urine are unremarkable.  Recommend treatment with NSAIDs, outpatient follow-up with GI as he also complains of chronic abdominal pain. ? ? ?  ? ? ?FINAL CLINICAL IMPRESSION(S) / ED DIAGNOSES  ? ?Final diagnoses:  ?Chronic bilateral thoracic back pain  ? ? ? ?  Rx / DC Orders  ? ?ED Discharge Orders   ? ?      Ordered  ?  naproxen (NAPROSYN) 500 MG tablet  2 times daily with meals       ? 05/06/21 0844  ? ?  ?  ? ?  ? ? ? ?Note:  This document was prepared using Dragon voice recognition software and may include unintentional dictation errors. ?  ?Jene Every, MD ?05/06/21 [phone] ? ?

## 2021-05-06 NOTE — ED Triage Notes (Signed)
Pt states that he was here a few weeks ago, for chest pain we did a work up and found nothing. Now he states that he has back pain for the last 18 months and thought that he it was just that but after goggling it he found out where his kidney are and states that he is having urine retention, and blood in his urine, along with many other complaints.  ?

## 2021-05-06 NOTE — Discharge Instructions (Addendum)
Your kidney function and urine tests are unremarkable, please consider following up with your primary care provider as needed for further evaluation ?

## 2021-06-17 ENCOUNTER — Emergency Department
Admission: EM | Admit: 2021-06-17 | Discharge: 2021-06-17 | Disposition: A | Discharge status: Home / Self Care | Payer: Self-pay | Attending: Emergency Medicine | Admitting: Emergency Medicine

## 2021-06-17 ENCOUNTER — Emergency Department: Payer: Self-pay

## 2021-06-17 ENCOUNTER — Other Ambulatory Visit: Payer: Self-pay

## 2021-06-17 DIAGNOSIS — K2900 Acute gastritis without bleeding: Secondary | ICD-10-CM | POA: Insufficient documentation

## 2021-06-17 DIAGNOSIS — R1084 Generalized abdominal pain: Secondary | ICD-10-CM

## 2021-06-17 LAB — COMPREHENSIVE METABOLIC PANEL
ALT: 16 U/L (ref 0–44)
AST: 31 U/L (ref 15–41)
Albumin: 4.1 g/dL (ref 3.5–5.0)
Alkaline Phosphatase: 36 U/L — ABNORMAL LOW (ref 38–126)
Anion gap: 12 (ref 5–15)
BUN: 12 mg/dL (ref 6–20)
CO2: 21 mmol/L — ABNORMAL LOW (ref 22–32)
Calcium: 9.2 mg/dL (ref 8.9–10.3)
Chloride: 108 mmol/L (ref 98–111)
Creatinine, Ser: 0.94 mg/dL (ref 0.61–1.24)
GFR, Estimated: >60 mL/min (ref >60)
Glucose, Bld: 120 mg/dL — ABNORMAL HIGH (ref 70–99)
Potassium: 3.6 mmol/L (ref 3.5–5.1)
Sodium: 141 mmol/L (ref 135–145)
Total Bilirubin: 0.6 mg/dL (ref 0.3–1.2)
Total Protein: 7.2 g/dL (ref 6.5–8.1)

## 2021-06-17 LAB — CBC
HCT: 43.5 % (ref 39.0–52.0)
Hemoglobin: 15 g/dL (ref 13.0–17.0)
MCH: 32.6 pg (ref 26.0–34.0)
MCHC: 34.5 g/dL (ref 30.0–36.0)
MCV: 94.6 fL (ref 80.0–100.0)
Platelets: 244 10*3/uL (ref 150–400)
RBC: 4.6 MIL/uL (ref 4.22–5.81)
RDW: 12.7 % (ref 11.5–15.5)
WBC: 11.3 10*3/uL — ABNORMAL HIGH (ref 4.0–10.5)
nRBC: 0 % (ref 0.0–0.2)

## 2021-06-17 LAB — LIPASE, BLOOD: Lipase: 31 U/L (ref 11–51)

## 2021-06-17 MED ORDER — ONDANSETRON HCL 4 MG/2ML IJ SOLN
4.0000 mg | Freq: Once | INTRAMUSCULAR | Status: AC
Start: 2021-06-17 — End: 2021-06-17
  Administered 2021-06-17: 4 mg via INTRAVENOUS
  Filled 2021-06-17: qty 2

## 2021-06-17 MED ORDER — KETOROLAC TROMETHAMINE 30 MG/ML IJ SOLN
30.0000 mg | Freq: Once | INTRAMUSCULAR | Status: AC
  Administered 2021-06-17: 30 mg via INTRAVENOUS
  Filled 2021-06-17: qty 1

## 2021-06-17 MED ORDER — SODIUM CHLORIDE 0.9 % IV BOLUS
500.0000 mL | Freq: Once | INTRAVENOUS | Status: AC
  Administered 2021-06-17: 500 mL via INTRAVENOUS

## 2021-06-17 MED ORDER — ONDANSETRON 4 MG PO TBDP: 4.0000 mg | ORAL_TABLET | Freq: Three times a day (TID) | ORAL | 0 refills | Status: DC | PRN

## 2021-06-17 MED ORDER — IOHEXOL 300 MG/ML  SOLN
100.0000 mL | Freq: Once | INTRAMUSCULAR | Status: AC | PRN
  Administered 2021-06-17: 100 mL via INTRAVENOUS

## 2021-06-17 NOTE — ED Provider Notes (Signed)
? ?[facility] ?Provider Note ? ? ? Event Date/Time  ? First MD Initiated Contact with Patient 06/17/21 [phone]   ?  (approximate) ? ? ?History  ? ?Abdominal Pain ? ? ?HPI ? ?Edgar Matthews is a 41 y.o. male with history of an appendectomy who presents with complaints of generalized abdominal pain.  Patient reports he woke up this morning, had some heartburn followed by diarrhea followed by pain in his left upper quadrant which he reports is now generalized abdominal pain which is severe.  Has not take anything for this.  Took EMS here.  Review of medical records demonstrates had reassuring CT in December 2022 ?  ? ? ?Physical Exam  ? ?Triage Vital Signs: ?ED Triage Vitals  ?Enc Vitals Group  ?   BP 06/17/21 0804 127/81  ?   Pulse Rate 06/17/21 0804 77  ?   Resp 06/17/21 0804 (!) 22  ?   Temp 06/17/21 0804 98 ?F (36.7 ?C)  ?   Temp src --   ?   SpO2 06/17/21 0804 98 %  ?   Weight --   ?   Height --   ?   Head Circumference --   ?   Peak Flow --   ?   Pain Score 06/17/21 0802 10  ?   Pain Loc --   ?   Pain Edu? --   ?   Excl. in [place]? --   ? ? ?Most recent vital signs: ?Vitals:  ? 06/17/21 0945 06/17/21 1032  ?BP:  125/87  ?Pulse: 61 70  ?Resp:  19  ?Temp:  98 ?F (36.7 ?C)  ?SpO2: 95% 100%  ? ? ? ?General: Awake, no distress.  Anxious, slightly aggressive ?CV:  Good peripheral perfusion.  ?Resp:  Normal effort.  ?Abd:  No distention.  Mild tenderness to the abdomen diffusely although exam somewhat limited ?Other:   ? ? ?ED Results / Procedures / Treatments  ? ?Labs ?(all labs ordered are listed, but only abnormal results are displayed) ?Labs Reviewed  ?COMPREHENSIVE METABOLIC PANEL - Abnormal; Notable for the following components:  ?    Result Value  ? CO2 21 (*)   ? Glucose, Bld 120 (*)   ? Alkaline Phosphatase 36 (*)   ? All other components within normal limits  ?CBC - Abnormal; Notable for the following components:  ? WBC 11.3 (*)   ? All other components within normal limits  ?LIPASE, BLOOD   ?URINALYSIS, ROUTINE W REFLEX MICROSCOPIC  ? ? ? ?EKG ?ED ECG REPORT ?I, Lavonia Drafts, the attending physician, personally viewed and interpreted this ECG. ? ?Date: 06/17/2021 ? ?Rhythm: normal sinus rhythm ?QRS Axis: normal ?Intervals: normal ?ST/T Wave abnormalities: normal ?Narrative Interpretation: no evidence of acute ischemia ? ? ? ? ?RADIOLOGY ? ? ? ? ?PROCEDURES: ? ?Critical Care performed:  ? ?Procedures ? ? ?MEDICATIONS ORDERED IN ED: ?Medications  ?ketorolac (TORADOL) 30 MG/ML injection 30 mg (30 mg Intravenous Given 06/17/21 0841)  ?sodium chloride 0.9 % bolus 500 mL (0 mLs Intravenous Stopped 06/17/21 1032)  ?ondansetron [facility]) injection 4 mg (4 mg Intravenous Given 06/17/21 0841)  ?iohexol (OMNIPAQUE) 300 MG/ML solution 100 mL (100 mLs Intravenous Contrast Given 06/17/21 0930)  ? ? ? ?IMPRESSION / MDM / ASSESSMENT AND PLAN / ED COURSE  ?I reviewed the triage vital signs and the nursing notes. ? ?Patient presents with abdominal pain as detailed above, he is somewhat aggressive which he attributes to pain.  We will  obtain labs, give IV analgesics, IV Zofran, obtain CT abdomen pelvis and reevaluate. ? ?Lab work reviewed and is reassuring ? ?CT scan is overall reassuring, patient is feeling better after IV Toradol.  He feels ready for discharge.  Strongly urged him to follow-up with GI as scheduled next week given repeat episodes of abdominal discomfort possibility of inflammatory bowel disease etc., he knows he can return the emergency department anytime. ? ? ? ? ? ? ?  ? ? ?FINAL CLINICAL IMPRESSION(S) / ED DIAGNOSES  ? ?Final diagnoses:  ?Generalized abdominal pain  ?Acute gastritis without hemorrhage, unspecified gastritis type  ? ? ? ?Rx / DC Orders  ? ?ED Discharge Orders   ? ?      Ordered  ?  ondansetron (ZOFRAN-ODT) 4 MG disintegrating tablet  Every 8 hours PRN       ? 06/17/21 1029  ? ?  ?  ? ?  ? ? ? ?Note:  This document was prepared using Dragon voice recognition software and may include  unintentional dictation errors. ?  ?Lavonia Drafts, MD ?06/17/21 1448 ? ?

## 2021-06-17 NOTE — ED Notes (Addendum)
Pt refusing IV and blood draw. Pt continues to argue with staff. ? ?Staff exited room and then pt began yelling. Staff didn't not enter back into room due to safety. ? ?

## 2021-06-17 NOTE — ED Triage Notes (Signed)
Pt comes with c/o abdominal pain and vomiting. Pt states pain is 100. VSS per EMS. Pt state he had spicy margarita last night and this started. ? ? ?

## 2022-03-16 ENCOUNTER — Emergency Department
Admission: EM | Admit: 2022-03-16 | Discharge: 2022-03-17 | Payer: Self-pay | Attending: Emergency Medicine | Admitting: Emergency Medicine

## 2022-03-16 ENCOUNTER — Other Ambulatory Visit: Payer: Self-pay

## 2022-03-16 ENCOUNTER — Emergency Department: Payer: Self-pay

## 2022-03-16 DIAGNOSIS — M25552 Pain in left hip: Secondary | ICD-10-CM | POA: Diagnosis not present

## 2022-03-16 DIAGNOSIS — Y92410 Unspecified street and highway as the place of occurrence of the external cause: Secondary | ICD-10-CM | POA: Diagnosis not present

## 2022-03-16 DIAGNOSIS — M7918 Myalgia, other site: Secondary | ICD-10-CM

## 2022-03-16 MED ORDER — KETOROLAC TROMETHAMINE 30 MG/ML IJ SOLN
30.0000 mg | Freq: Once | INTRAMUSCULAR | Status: AC
  Administered 2022-03-16: 30 mg via INTRAMUSCULAR
  Filled 2022-03-16: qty 1

## 2022-03-16 MED ORDER — ACETAMINOPHEN 500 MG PO TABS
1000.0000 mg | ORAL_TABLET | Freq: Once | ORAL | Status: AC
  Administered 2022-03-16: 1000 mg via ORAL
  Filled 2022-03-16: qty 2
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
## 2022-03-16 NOTE — ED Notes (Signed)
Pt in CT and then xray, will be roomed afterwards

## 2022-03-16 NOTE — Discharge Instructions (Signed)
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
## 2022-07-23 IMAGING — CR DG CHEST 2V
2 series · 2 of 2 positions shown · non-contrast
Comparison: 06/02/2016.

CLINICAL DATA: Chest pain

EXAM:
CHEST - 2 VIEW

[chest pa]
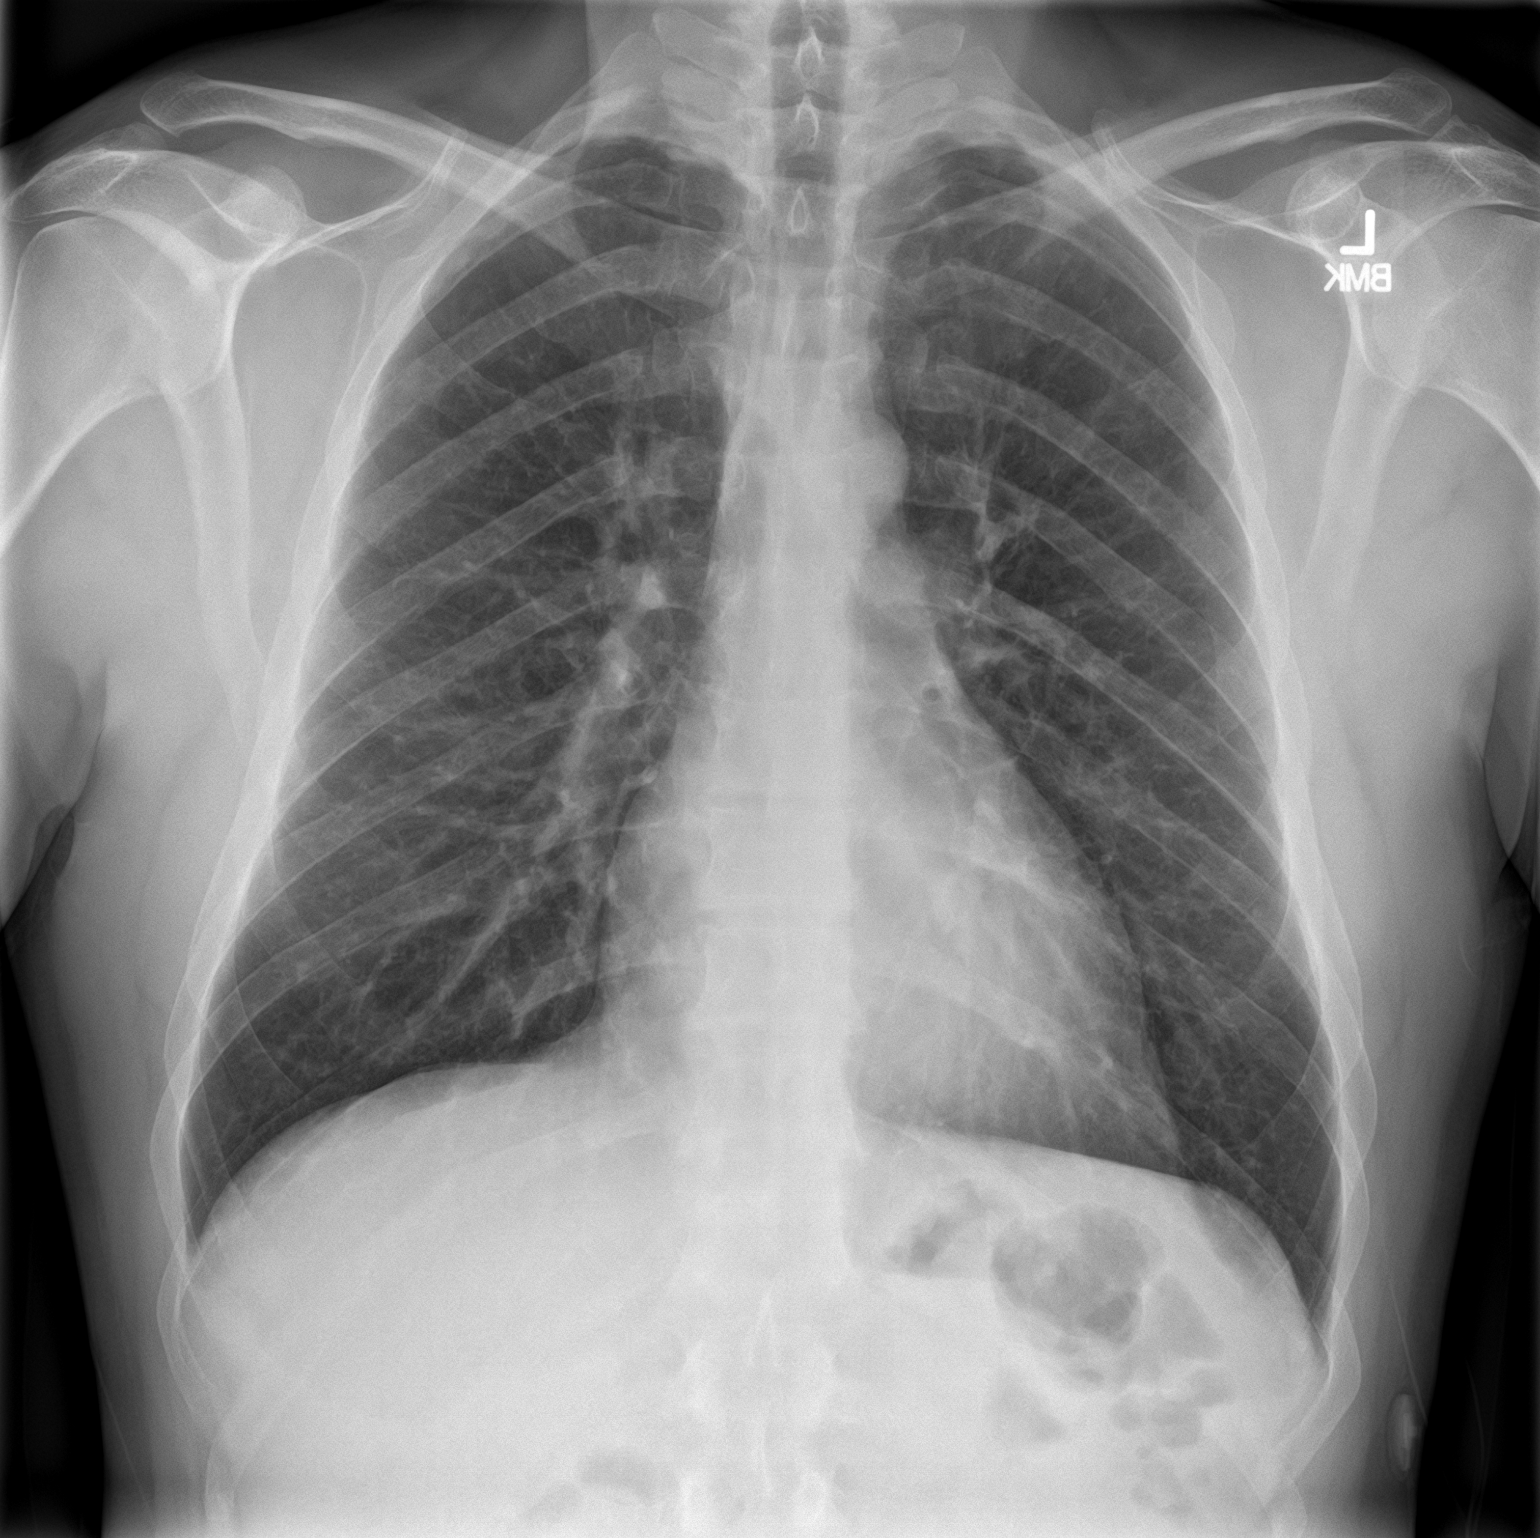

[chest lat]
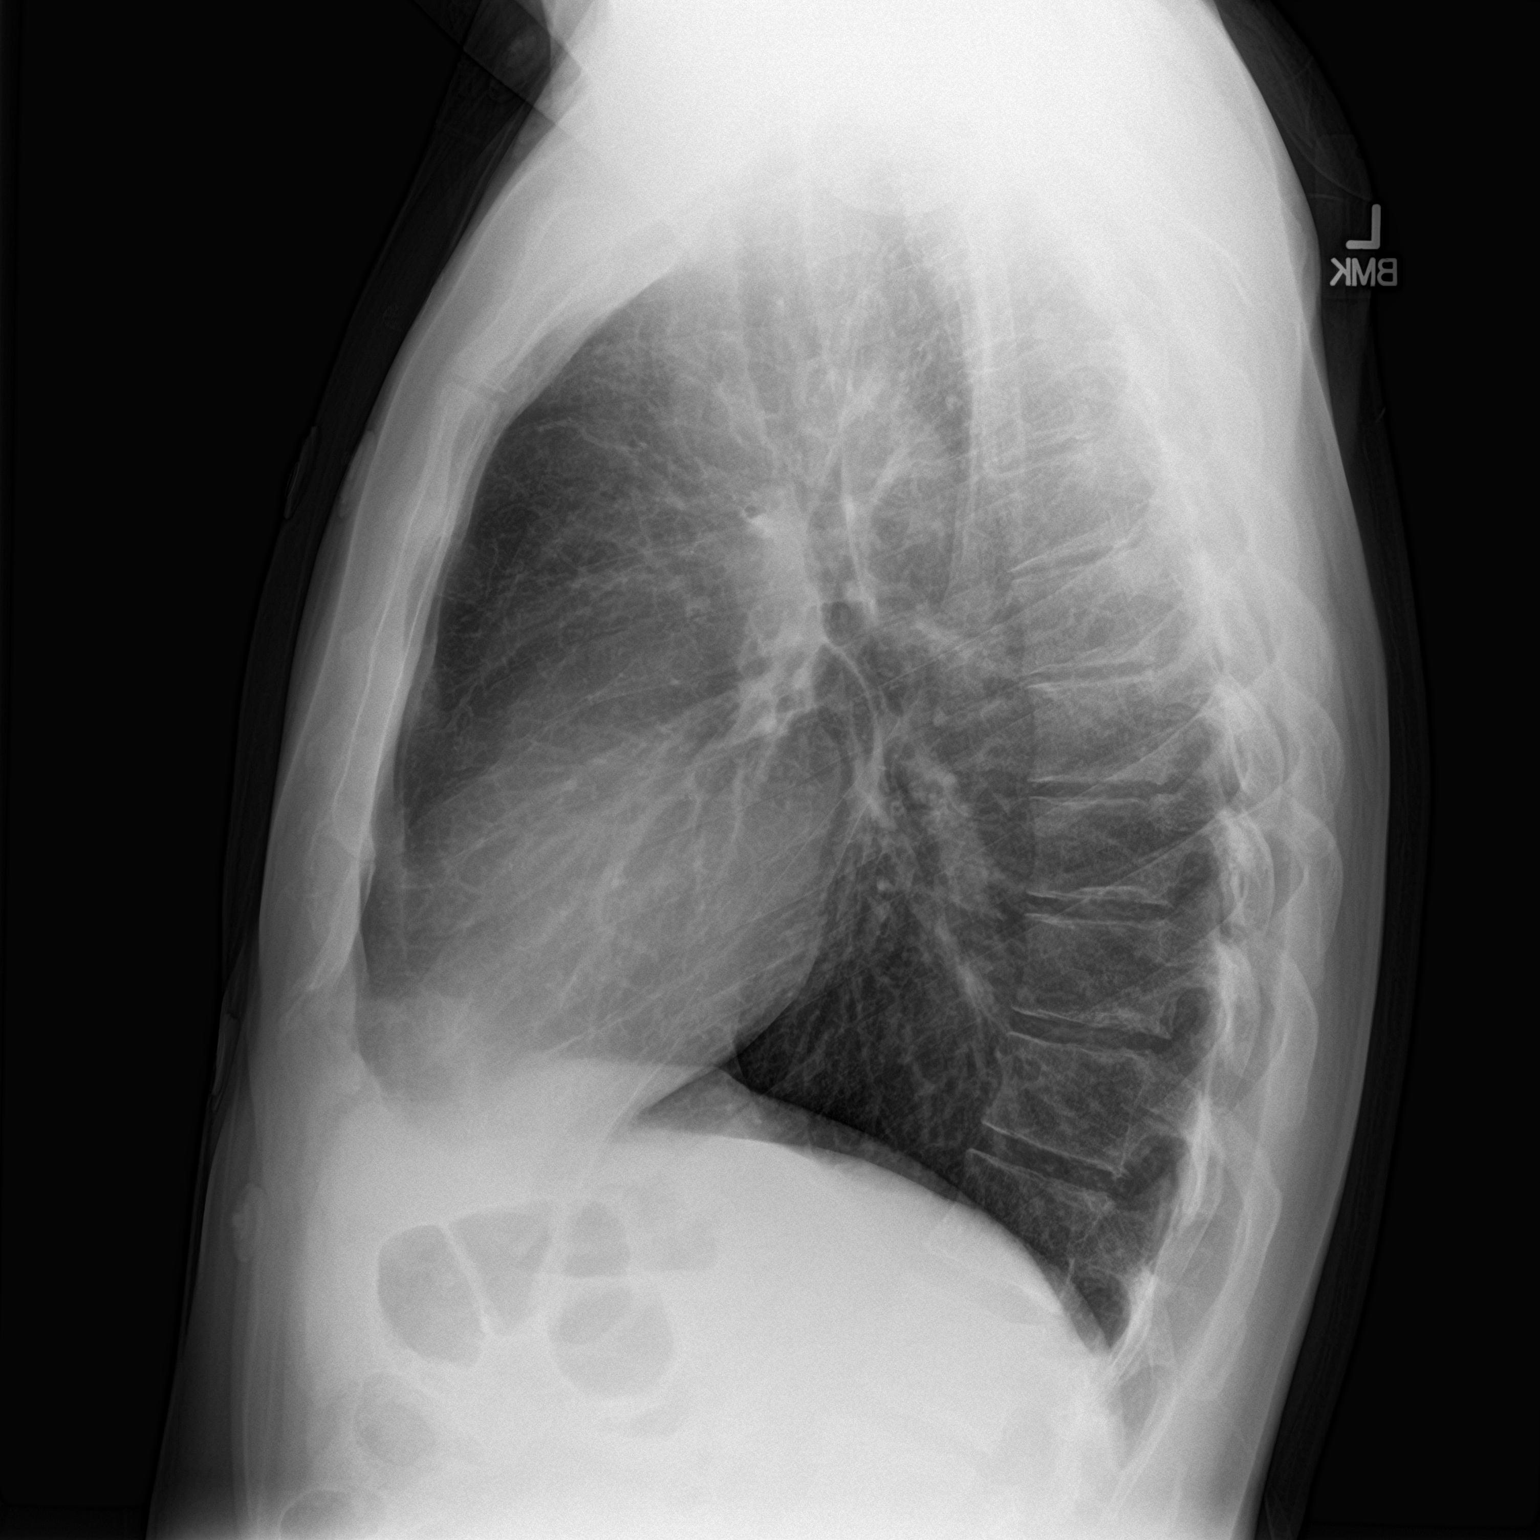

[2 of 2 positions shown; findings below may reference images not displayed]

FINDINGS: Cardiac and mediastinal contours are within normal limits. No focal
pulmonary opacity. No pleural effusion or pneumothorax. No acute
osseous abnormality.
IMPRESSION: No acute cardiopulmonary process.

## 2022-07-23 IMAGING — CT CT ABD-PELV W/O CM
2 of 4 series · 16 of 46 positions shown, 18 images · non-contrast
Comparison: None.

CLINICAL DATA: Left-sided abdominal pain for 3 weeks, nausea, fever

EXAM:
CT ABDOMEN AND PELVIS WITHOUT CONTRAST
TECHNIQUE: Multidetector CT imaging of the abdomen and pelvis was performed
following the standard protocol without IV contrast. Unenhanced CT
was performed per clinician order. Lack of IV contrast limits
sensitivity and specificity, especially for evaluation of
abdominal/pelvic solid viscera.

[Series 2: routine abd/pel wo · axial · 0.79mm/px · z∈[-457,-22]mm · 13 of 95 slices shown, 15 images]
[im 4/95  soft-tissue]
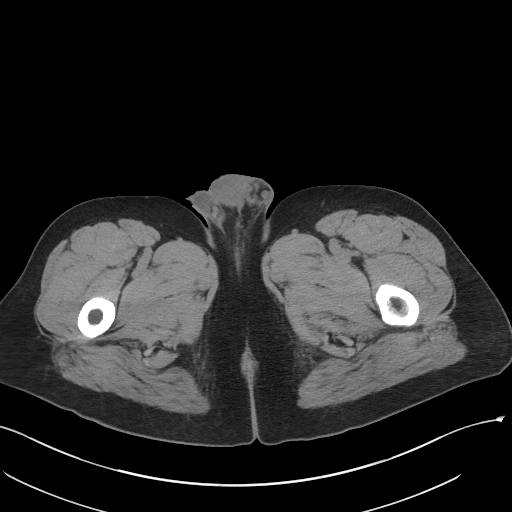
[im 4/95  bone]
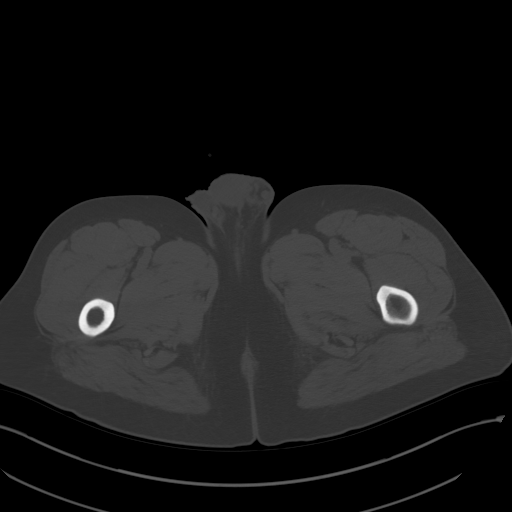
[im 12/95  soft-tissue]
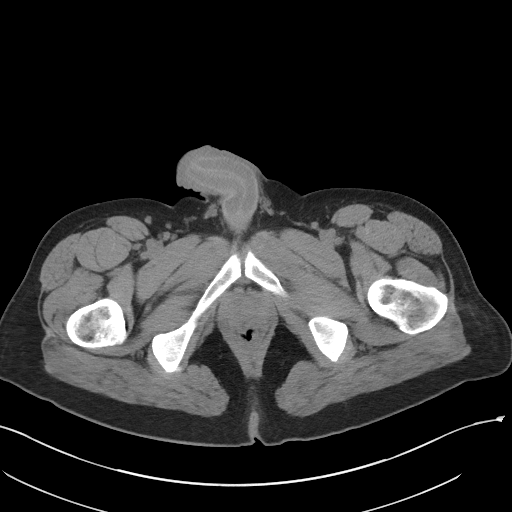
[im 20/95  soft-tissue]
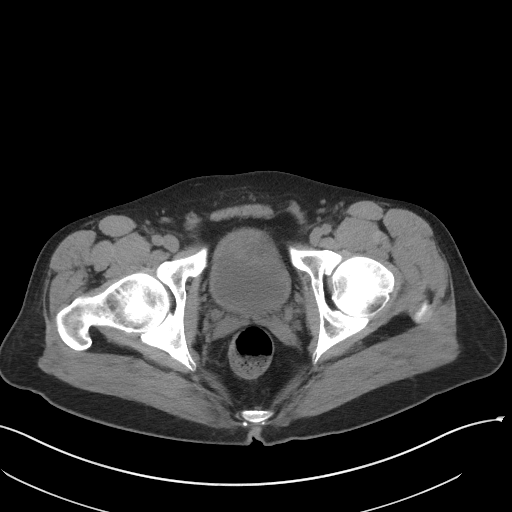
[im 28/95  soft-tissue]
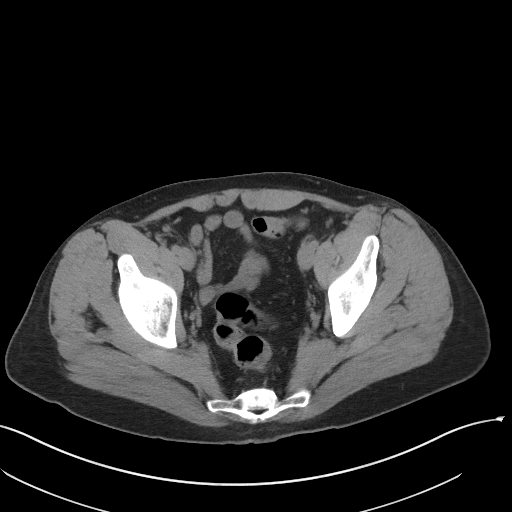
[im 32/95  soft-tissue]
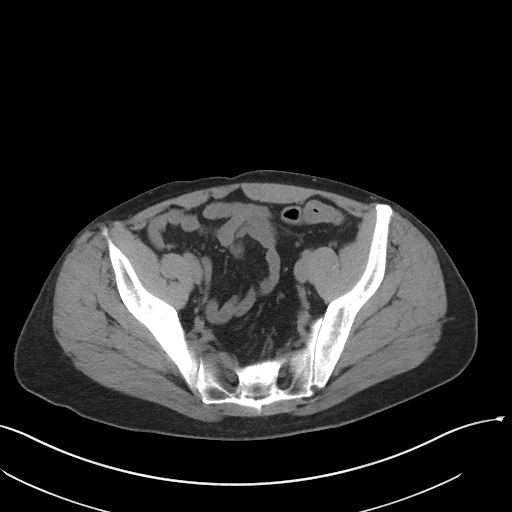
[im 40/95  soft-tissue]
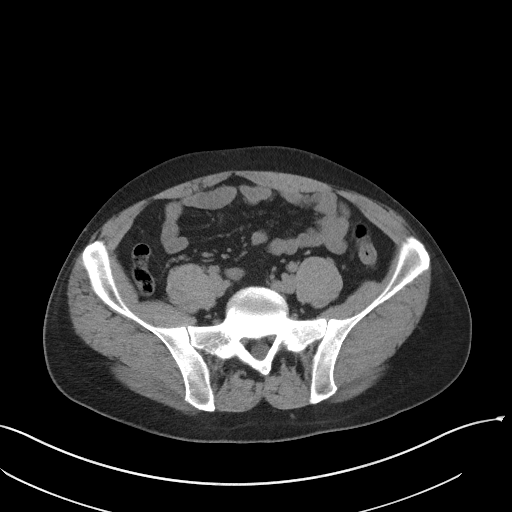
[im 48/95  soft-tissue]
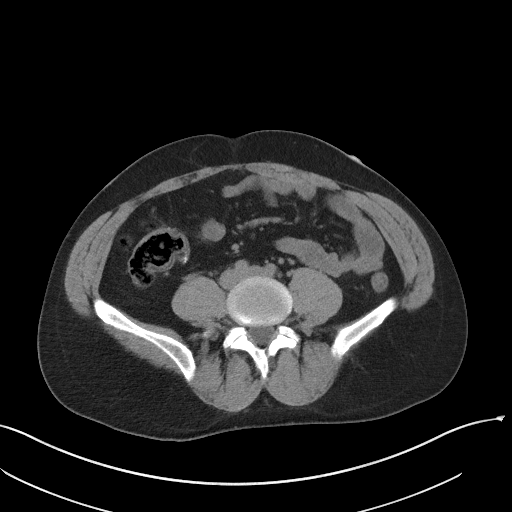
[im 55/95  soft-tissue]
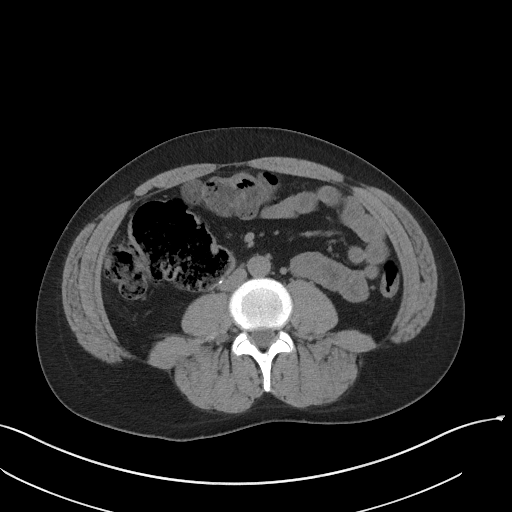
[im 63/95  soft-tissue]
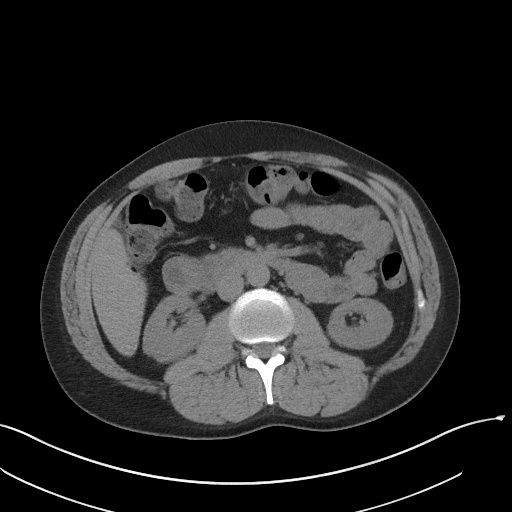
[im 63/95  bone]
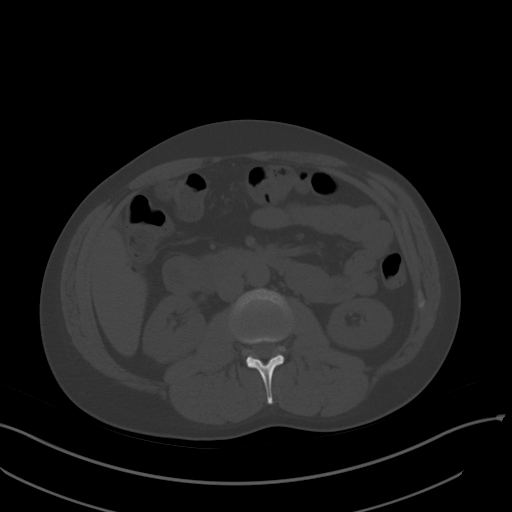
[im 67/95  soft-tissue]
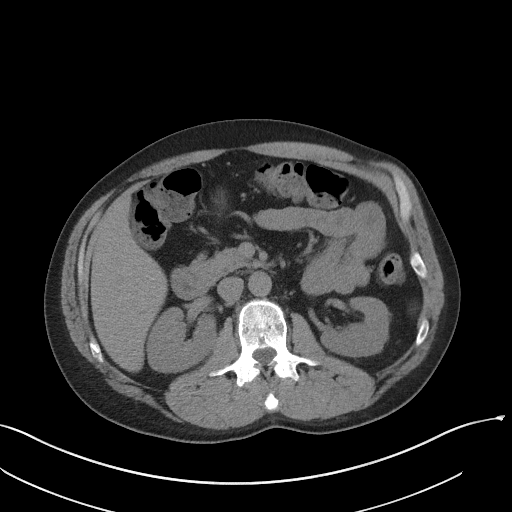
[im 75/95  soft-tissue]
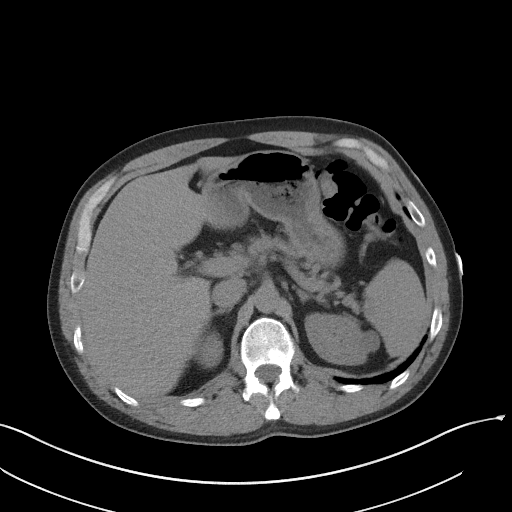
[im 83/95  soft-tissue]
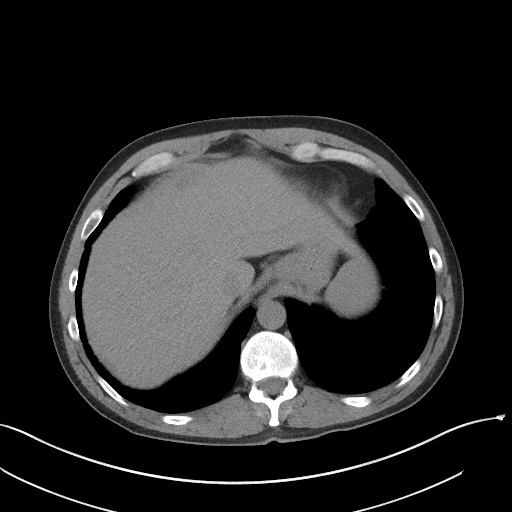
[im 91/95  soft-tissue]
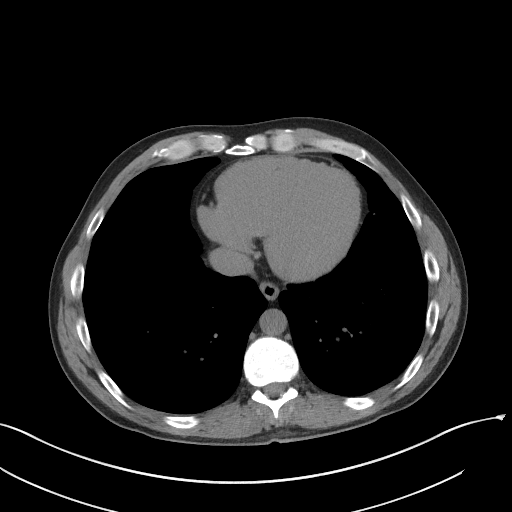

[Series 5: coronal st · coronal · 0.74mm/px · 3 of 89 slices shown]
[im 30/89  soft-tissue]
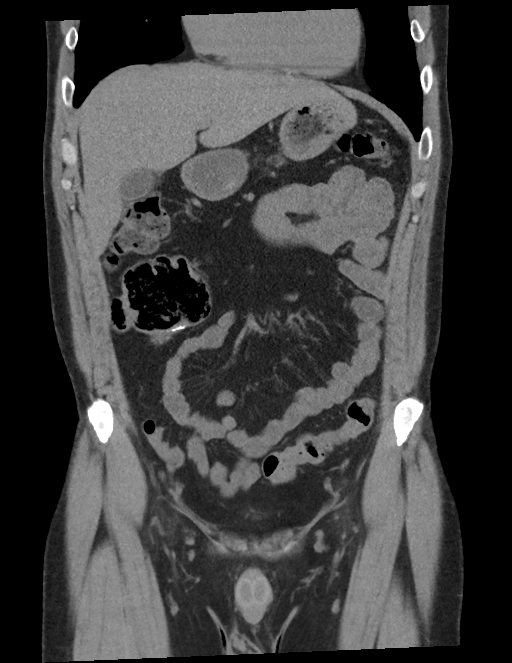
[im 40/89  soft-tissue]
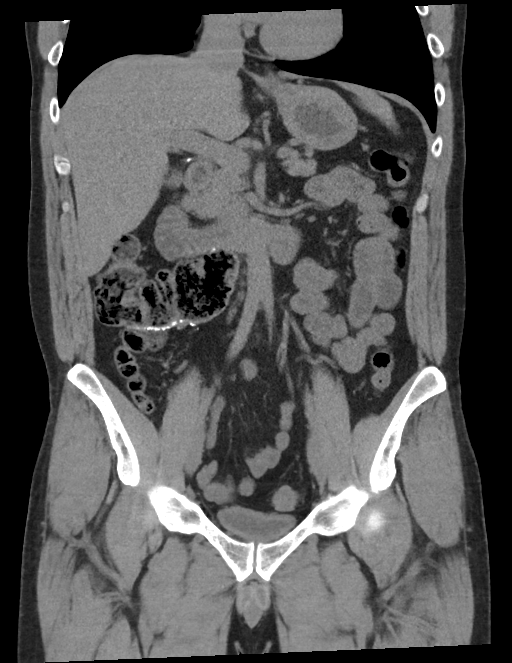
[im 49/89  soft-tissue]
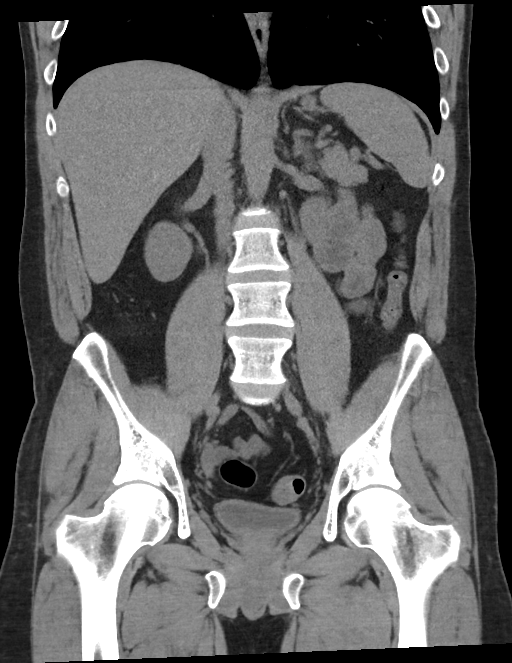

[16 of 46 positions shown; findings below may reference images not displayed]

FINDINGS: Lower chest: No acute pleural or parenchymal lung disease.

Hepatobiliary: There are multiple small calcified gallstones without
evidence of acute cholecystitis. Several small calculi toward the
gallbladder neck may extend in the cystic duct. Unenhanced imaging
of the liver is unremarkable.

Pancreas: Unremarkable unenhanced appearance.

Spleen: Unremarkable unenhanced appearance.

Adrenals/Urinary Tract: 1.9 cm exophytic cyst upper pole left
kidney. Otherwise the kidneys are unremarkable. No urinary tract
calculi or obstructive uropathy.

Bladder is minimally distended, limiting its evaluation. The
adrenals are unremarkable.

Stomach/Bowel: No bowel obstruction or ileus. Postsurgical changes
from partial right colon resection and reanastomosis. No bowel wall
thickening or inflammatory change.

Vascular/Lymphatic: No significant vascular findings are present. No
enlarged abdominal or pelvic lymph nodes.

Reproductive: Prostate is unremarkable.

Other: No free fluid or free intraperitoneal gas. No abdominal wall
hernia.

Musculoskeletal: No acute or destructive bony lesions. Reconstructed
images demonstrate no additional findings.
IMPRESSION: 1. Cholelithiasis without cholecystitis.
2. Otherwise unremarkable unenhanced CT of the abdomen and pelvis.

## 2022-11-25 IMAGING — CT CT ABD-PELV W/ CM
2 of 5 series · 15 of 46 positions shown, 17 images · IV contrast (APPLIED)
Comparison: February 12, 2021.

CLINICAL DATA: 41-year-old presenting with acute abdominal pain.

EXAM:
CT ABDOMEN AND PELVIS WITH CONTRAST
TECHNIQUE: Multidetector CT imaging of the abdomen and pelvis was performed
using the standard protocol following bolus administration of
intravenous contrast.

[Series 2: abdomen 5.0 · axial · 0.72mm/px · z∈[+838,+1224]mm · 12 of 91 slices shown, 14 images]
[im 7/91  soft-tissue]
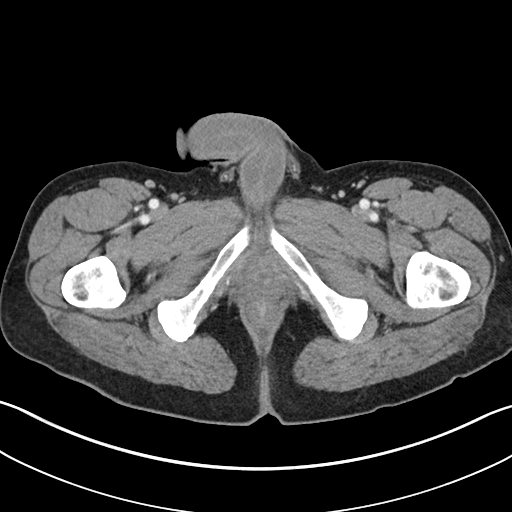
[im 7/91  bone]
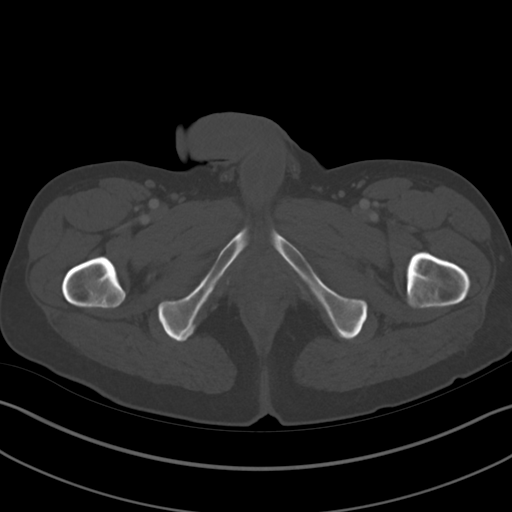
[im 13/91  soft-tissue]
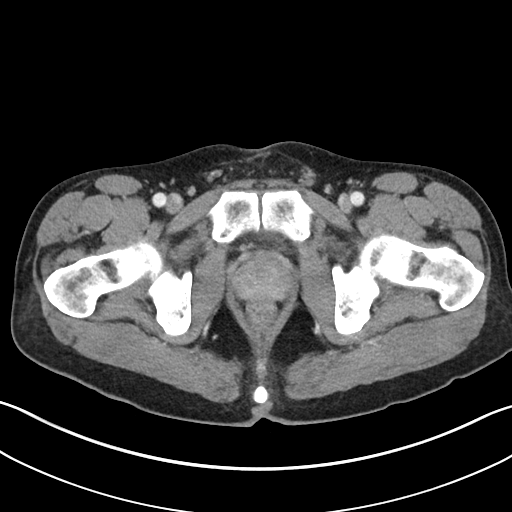
[im 20/91  soft-tissue]
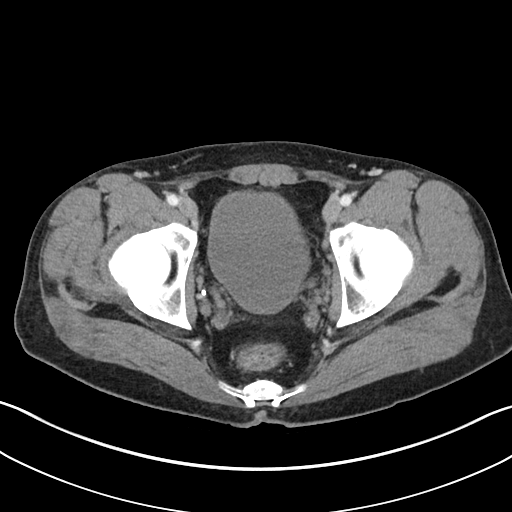
[im 26/91  soft-tissue]
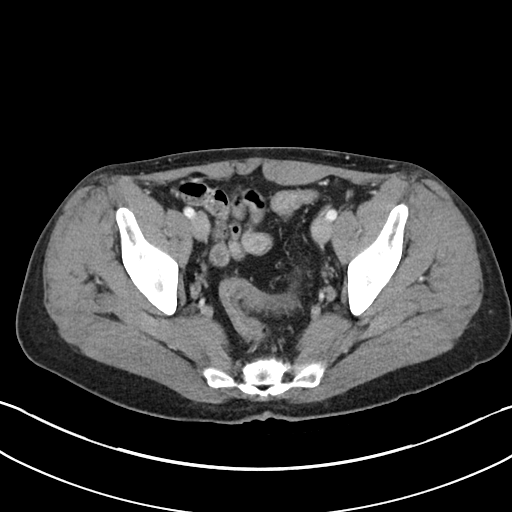
[im 33/91  soft-tissue]
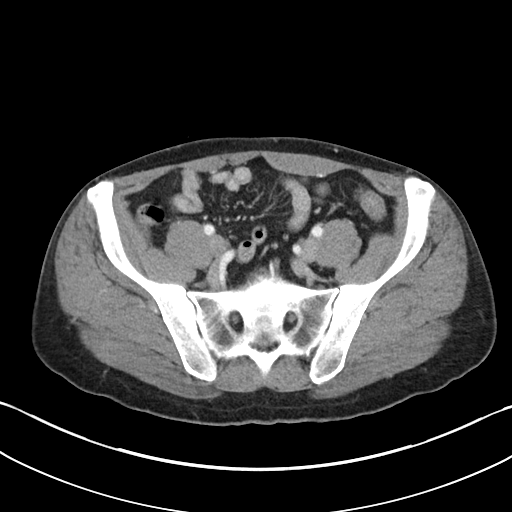
[im 39/91  soft-tissue]
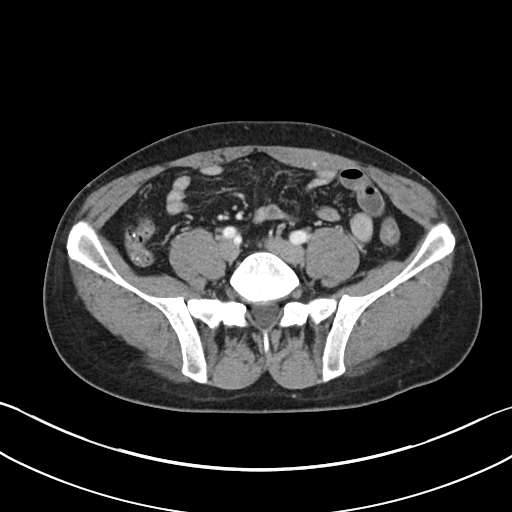
[im 52/91  soft-tissue]
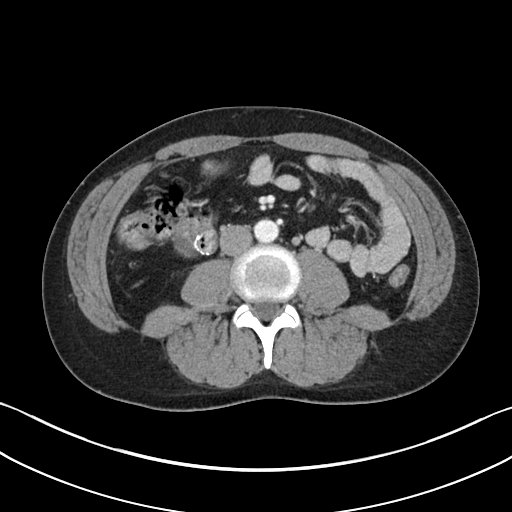
[im 58/91  soft-tissue]
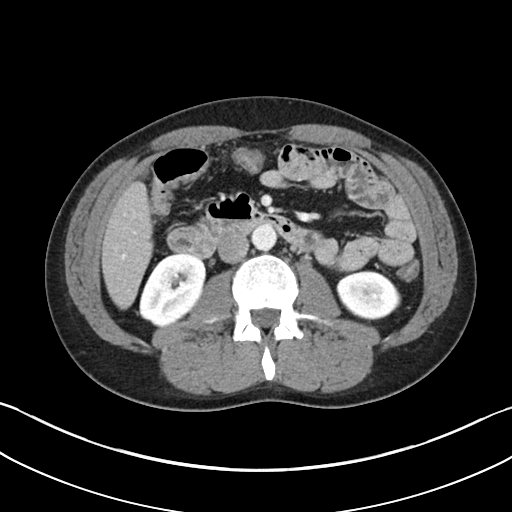
[im 65/91  soft-tissue]
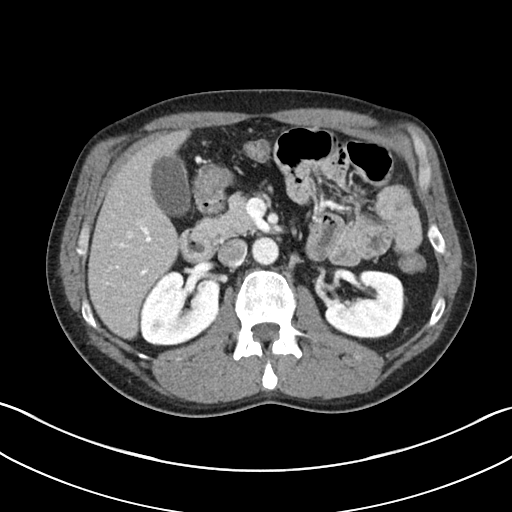
[im 65/91  bone]
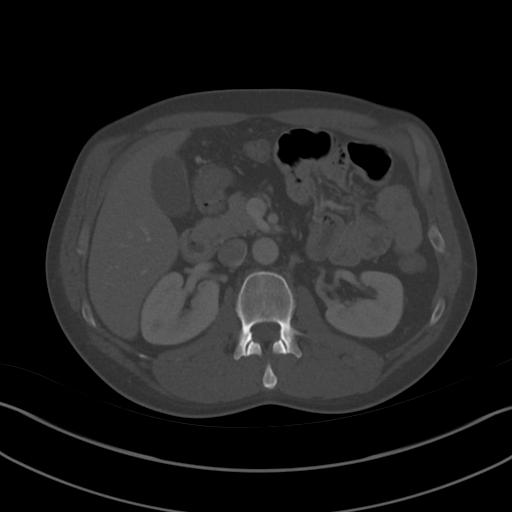
[im 71/91  soft-tissue]
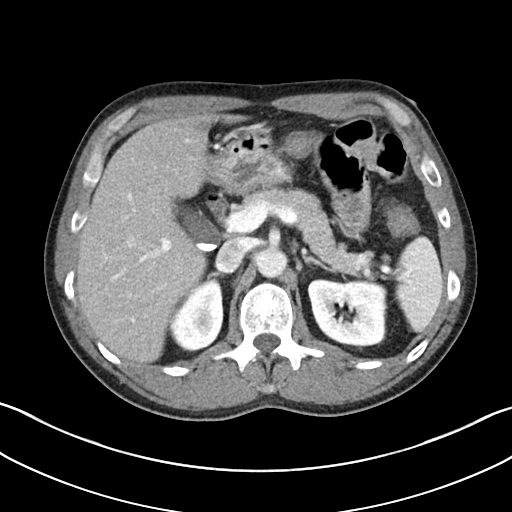
[im 78/91  soft-tissue]
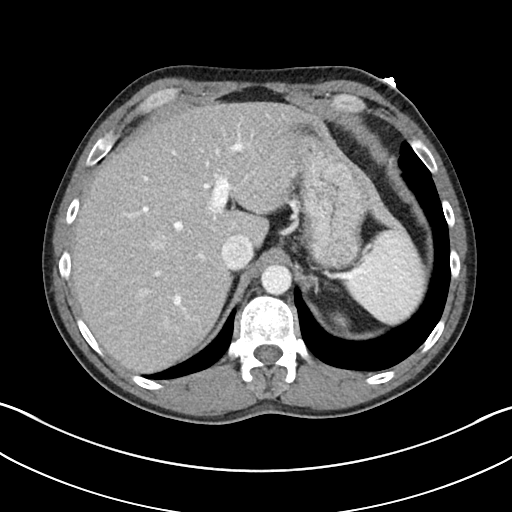
[im 84/91  soft-tissue]
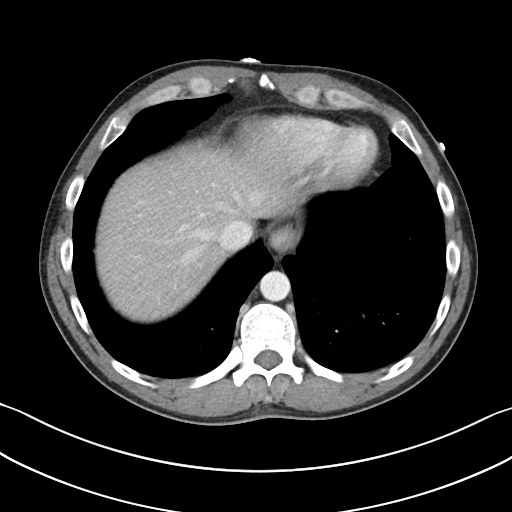

[Series 5: abdomen 3.0 mpr cor · coronal · 0.78mm/px · 3 of 92 slices shown]
[im 31/92  soft-tissue]
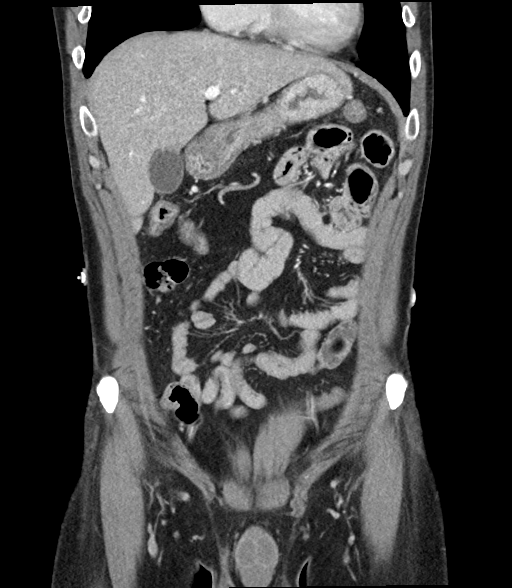
[im 41/92  soft-tissue]
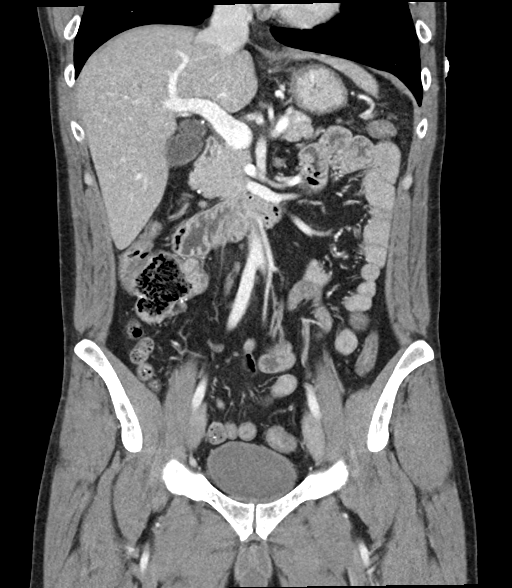
[im 51/92  soft-tissue]
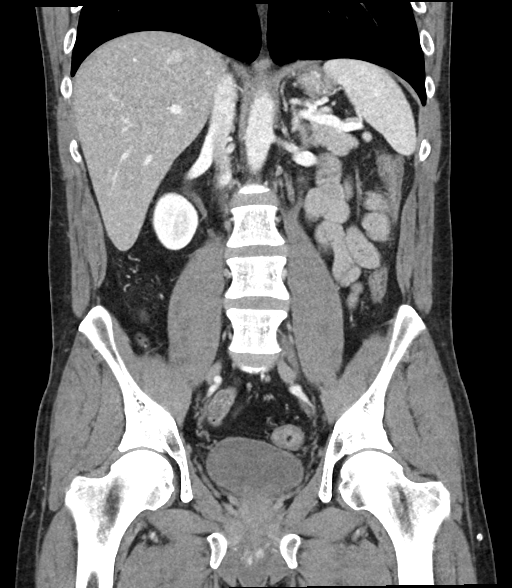

[15 of 46 positions shown; findings below may reference images not displayed]

RADIATION DOSE REDUCTION: This exam was performed according to the
departmental dose-optimization program which includes automated
exposure control, adjustment of the mA and/or kV according to
patient size and/or use of iterative reconstruction technique.

CONTRAST:  100mL OMNIPAQUE IOHEXOL 300 MG/ML  SOLN
FINDINGS: Lower chest: Incidental imaging of the lung bases without signs of
effusion or evidence of consolidative change.

Hepatobiliary: Smooth hepatic contours. No focal, suspicious hepatic
lesion. No pericholecystic stranding or signs of biliary duct
dilation. Portal vein is patent. Mild hepatic steatosis. w
gallstones layer dependently in the proximal/central portion the
gallbladder.

Pancreas: Normal, without mass, inflammation or ductal dilatation.

Spleen: Normal.

Adrenals/Urinary Tract:

Adrenal glands are unremarkable. Symmetric renal enhancement. No
sign of hydronephrosis. No suspicious renal lesion or perinephric
stranding.

Urinary bladder is grossly unremarkable. Small renal cyst arises
from the upper pole of the LEFT kidney, measuring 1.8 cm no
follow-up recommended for this finding.

Stomach/Bowel: Signs of ileocecal resection and anastomosis in the
RIGHT hemiabdomen. Colon is collapsed beyond this point. Query mild
colonic thickening versus under distension throughout the colon, no
substantial pericolonic stranding.

Under distension of the stomach query mild antral thickening of the
stomach. No small bowel dilation. No signs of small bowel
inflammation.

Vascular/Lymphatic:

Aorta with smooth contours. IVC with smooth contours. No aneurysmal
dilation of the abdominal aorta. There is no gastrohepatic or
hepatoduodenal ligament lymphadenopathy. No retroperitoneal or
mesenteric lymphadenopathy.

No pelvic sidewall lymphadenopathy.

Reproductive: Unremarkable by CT.

Other: No free air or ascites.

Musculoskeletal: No acute or significant osseous findings.
IMPRESSION: 1. Evidence of ileocecal resection. This could be related to prior
complicated appendectomy but is often seen in the setting of
inflammatory bowel disease. Correlate with clinical history and with
follow-up as warranted.
2. What is favored to represent under distension of the colon.
Question mild thickening more likely if there is a history of
inflammatory bowel disease. Correlate with any symptoms that would
suggest mild colitis.
3. Under distended gastric antrum with potential mild edema may be
due to mild gastritis in the setting of repeated vomiting.
4. Mild hepatic steatosis.
5. Cholelithiasis.
